# Patient Record
Sex: Female | Born: 1960
Health system: Southern US, Community
[De-identification: ages and names within clinical notes are randomized; demographics above are authoritative.]

## PROBLEM LIST (undated history)

## (undated) DIAGNOSIS — N189 Chronic kidney disease, unspecified: Secondary | ICD-10-CM

## (undated) DIAGNOSIS — R011 Cardiac murmur, unspecified: Secondary | ICD-10-CM

## (undated) DIAGNOSIS — R748 Abnormal levels of other serum enzymes: Secondary | ICD-10-CM

## (undated) DIAGNOSIS — D509 Iron deficiency anemia, unspecified: Secondary | ICD-10-CM

## (undated) DIAGNOSIS — I10 Essential (primary) hypertension: Secondary | ICD-10-CM

## (undated) DIAGNOSIS — R9431 Abnormal electrocardiogram [ECG] [EKG]: Secondary | ICD-10-CM

## (undated) DIAGNOSIS — R0600 Dyspnea, unspecified: Secondary | ICD-10-CM

## (undated) DIAGNOSIS — F32A Depression, unspecified: Secondary | ICD-10-CM

## (undated) DIAGNOSIS — E669 Obesity, unspecified: Secondary | ICD-10-CM

## (undated) DIAGNOSIS — R600 Localized edema: Secondary | ICD-10-CM

## (undated) HISTORY — DX: Cardiac murmur, unspecified: R01.1

## (undated) HISTORY — DX: Abnormal levels of other serum enzymes: R74.8

## (undated) HISTORY — DX: Abnormal electrocardiogram (ECG) (EKG): R94.31

## (undated) HISTORY — DX: Obesity, unspecified: E66.9

## (undated) HISTORY — PX: TUBAL LIGATION: SHX77

## (undated) HISTORY — DX: Localized edema: R60.0

## (undated) HISTORY — DX: Iron deficiency anemia, unspecified: D50.9

## (undated) HISTORY — DX: Dyspnea, unspecified: R06.00

## (undated) HISTORY — DX: Essential (primary) hypertension: I10

## (undated) HISTORY — PX: ABDOMINAL HYSTERECTOMY: SHX81

## (undated) HISTORY — DX: Chronic kidney disease, unspecified: N18.9

## (undated) HISTORY — DX: Depression, unspecified: F32.A

---

## 2011-06-14 ENCOUNTER — Encounter (HOSPITAL_COMMUNITY): Payer: Self-pay

## 2011-06-14 ENCOUNTER — Emergency Department (HOSPITAL_COMMUNITY)
Admission: EM | Admit: 2011-06-14 | Discharge: 2011-06-15 | Disposition: A | Discharge status: Home / Self Care | Payer: BC Managed Care – PPO | Attending: Emergency Medicine | Admitting: Emergency Medicine

## 2011-06-14 DIAGNOSIS — S61012A Laceration without foreign body of left thumb without damage to nail, initial encounter: Secondary | ICD-10-CM

## 2011-06-14 DIAGNOSIS — W268XXA Contact with other sharp object(s), not elsewhere classified, initial encounter: Secondary | ICD-10-CM | POA: Insufficient documentation

## 2011-06-14 DIAGNOSIS — S61209A Unspecified open wound of unspecified finger without damage to nail, initial encounter: Secondary | ICD-10-CM | POA: Insufficient documentation

## 2011-06-14 MED ORDER — TETANUS-DIPHTH-ACELL PERTUSSIS 5-2.5-18.5 LF-MCG/0.5 IM SUSP
0.5000 mL | Freq: Once | INTRAMUSCULAR | Status: AC
  Administered 2011-06-14: 0.5 mL via INTRAMUSCULAR
  Filled 2011-06-14: qty 0.5

## 2011-06-14 NOTE — ED Notes (Signed)
Pt was cutting a box open and sliced her left thumb, she was unable to control the bleeding

## 2011-06-14 NOTE — ED Notes (Signed)
Pt sts she was cutting in her kitchen earlier when she sliced her thumb on accident. Thumb still bleeding at this time. Thumb washed and wrapped with gauze until PA able to see patient,.

## 2011-06-15 MED ORDER — LIDOCAINE HCL 1 % IJ SOLN
INTRAMUSCULAR | Status: AC
  Administered 2011-06-15
  Filled 2011-06-15: qty 20

## 2011-06-15 NOTE — ED Provider Notes (Signed)
History     CSN: [id number]  Arrival date & time 06/14/11  2109   First MD Initiated Contact with Patient 06/14/11 2316      Chief Complaint  Patient presents with  . Extremity Laceration    (Consider location/radiation/quality/duration/timing/severity/associated sxs/prior treatment) HPI Comments: Patient here with left thumb laceration - states that she was using a box cutter to open a box when it slipped and she cut her left thumb at the medial aspect of the thumb - continues to ooze blood - reports tetanus not up to date  Patient is a 51 y.o. female presenting with hand pain. The history is provided by the patient. No language interpreter was used.  Hand Pain This is a new problem. The current episode started today. The problem occurs constantly. The problem has been unchanged. Associated symptoms include myalgias. Pertinent negatives include no abdominal pain, anorexia, arthralgias, change in bowel habit, chest pain, chills, congestion, coughing, diaphoresis, fatigue, fever, headaches, joint swelling, nausea, neck pain, numbness, rash, sore throat, swollen glands, urinary symptoms, vertigo, visual change, vomiting or weakness. The symptoms are aggravated by bending. She has tried nothing for the symptoms. The treatment provided no relief.    History reviewed. No pertinent past medical history.  History reviewed. No pertinent past surgical history.  History reviewed. No pertinent family history.  History  Substance Use Topics  . Smoking status: Not on file  . Smokeless tobacco: Not on file  . Alcohol Use: No    OB History    Grav Para Term Preterm Abortions TAB SAB Ect Mult Living                  Review of Systems  Constitutional: Negative for fever, chills, diaphoresis and fatigue.  HENT: Negative for congestion, sore throat and neck pain.   Respiratory: Negative for cough.   Cardiovascular: Negative for chest pain.  Gastrointestinal: Negative for nausea, vomiting,  abdominal pain, anorexia and change in bowel habit.  Musculoskeletal: Positive for myalgias. Negative for joint swelling and arthralgias.  Skin: Negative for rash.  Neurological: Negative for vertigo, weakness, numbness and headaches.  All other systems reviewed and are negative.    Allergies  Review of patient's allergies indicates no known allergies.  Home Medications  No current outpatient prescriptions on file.  BP 157/87  Pulse 9  Temp(Src) 98.3 F (36.8 C) (Oral)  Resp 18  SpO2 96%  Physical Exam  Nursing note and vitals reviewed. Constitutional: She is oriented to person, place, and time. She appears well-developed and well-nourished. No distress.  HENT:  Head: Normocephalic and atraumatic.  Right Ear: External ear normal.  Left Ear: External ear normal.  Nose: Nose normal.  Mouth/Throat: Oropharynx is clear and moist. No oropharyngeal exudate.  Eyes: Conjunctivae are normal. Pupils are equal, round, and reactive to light. No scleral icterus.  Neck: Normal range of motion. Neck supple.  Cardiovascular: Normal rate, regular rhythm and normal heart sounds.  Exam reveals no gallop and no friction rub.   No murmur heard. Pulmonary/Chest: Effort normal and breath sounds normal. She exhibits no tenderness.  Abdominal: Soft. Bowel sounds are normal. She exhibits no distension. There is no tenderness.  Musculoskeletal: Normal range of motion. She exhibits no edema and no tenderness.       1cm laceration to left thumb - currently hemostatic  Lymphadenopathy:    She has no cervical adenopathy.  Neurological: She is alert and oriented to person, place, and time. No cranial nerve deficit.  Skin:  Skin is warm and dry. No rash noted. No erythema. No pallor.  Psychiatric: She has a normal mood and affect. [name]. Judgment and thought content normal.    ED Course  Procedures (including critical care time)  Labs Reviewed - No data to display No results  found.   LACERATION REPAIR Performed by: Patrecia Pour. Authorized by: Patrecia Pour Consent: Verbal consent obtained. Risks and benefits: risks, benefits and alternatives were discussed Consent given by: patient Patient identity confirmed: provided demographic data Prepped and Draped in normal sterile fashion Wound explored  Laceration Location: left thumb  Laceration Length: 1cm  No Foreign Bodies seen or palpated  Anesthesia: local infiltration  Local anesthetic: lidocaine 1% without epinephrine  Anesthetic total: 2 ml  Irrigation method: syringe Amount of cleaning: standard  Skin closure: nylon 5.0  Number of sutures: 2  Technique: simple interrupted  Patient tolerance: Patient tolerated the procedure well with no immediate complications.  Left thumb laceration  MDM  Patient here with left thumb laceration - no evidence of foreign body - placed two sutures - heart rate noted to be 9 in triage, but checked myself and it was 86        Pailyn [place] C. [place], [place] 06/15/11 0020

## 2011-06-15 NOTE — ED Notes (Signed)
Patient given discharge instructions, information, prescriptions, and diet order. Patient states that they adequately understand discharge information given and to return to ED if symptoms return or worsen.     

## 2011-06-15 NOTE — ED Provider Notes (Signed)
Medical screening examination/treatment/procedure(s) were performed by non-physician practitioner and as supervising physician I was immediately available for consultation/collaboration.  Olivia Mackie, MD 06/15/11 [phone]

## 2011-06-15 NOTE — Discharge Instructions (Signed)
Laceration Care, Adult A laceration is a cut that goes through all layers of the skin. The cut goes into the tissue beneath the skin. HOME CARE For stitches (sutures) or staples:  Keep the cut clean and dry.   If you have a bandage (dressing), change it at least once a day. Change the bandage if it gets wet or dirty, or as told by your doctor.   Wash the cut with soap and water 2 times a day. Rinse the cut with water. Pat it dry with a clean towel.   Put a thin layer of medicated cream on the cut as told by your doctor.   You may shower after the first 24 hours. Do not soak the cut in water until the stitches are removed.   Only take medicines as told by your doctor.   Have your stitches or staples removed as told by your doctor.  For skin adhesive strips:  Keep the cut clean and dry.   Do not get the strips wet. You may take a bath, but be careful to keep the cut dry.   If the cut gets wet, pat it dry with a clean towel.   The strips will fall off on their own. Do not remove the strips that are still stuck to the cut.  For wound glue:  You may shower or take baths. Do not soak or scrub the cut. Do not swim. Avoid heavy sweating until the glue falls off on its own. After a shower or bath, pat the cut dry with a clean towel.   Do not put medicine on your cut until the glue falls off.   If you have a bandage, do not put tape over the glue.   Avoid lots of sunlight or tanning lamps until the glue falls off. Put sunscreen on the cut for the first year to reduce your scar.   The glue will fall off on its own. Do not pick at the glue.  You may need a tetanus shot if:  You cannot remember when you had your last tetanus shot.   You have never had a tetanus shot.  If you need a tetanus shot and you choose not to have one, you may get tetanus. Sickness from tetanus can be serious. GET HELP RIGHT AWAY IF:   Your pain does not get better with medicine.   Your arm, hand, leg, or  foot loses feeling (numbness) or changes color.   Your cut is bleeding.   Your joint feels weak, or you cannot use your joint.   You have painful lumps on your body.   Your cut is red, puffy (swollen), or painful.   You have a red line on the skin near the cut.   You have yellowish-white fluid (pus) coming from the cut.   You have a fever.   You have a bad smell coming from the cut or bandage.   Your cut breaks open before or after stitches are removed.   You notice something coming out of the cut, such as wood or glass.   You cannot move a finger or toe.  MAKE SURE YOU:   Understand these instructions.   Will watch your condition.   Will get help right away if you are not doing well or get worse.  Document Released: 06/29/2007 Document Revised: 12/30/2010 Document Reviewed: 07/06/2010 [facility] Patient Information 2012 [place], [place].Laceration Care, Adult A laceration is a cut or lesion that goes through all layers  of the skin and into the tissue just beneath the skin. TREATMENT  Some lacerations may not require closure. Some lacerations may not be able to be closed due to an increased risk of infection. It is important to see your caregiver as soon as possible after an injury to minimize the risk of infection and maximize the opportunity for successful closure. If closure is appropriate, pain medicines may be given, if needed. The wound will be cleaned to help prevent infection. Your caregiver will use stitches (sutures), staples, wound glue (adhesive), or skin adhesive strips to repair the laceration. These tools bring the skin edges together to allow for faster healing and a better cosmetic outcome. However, all wounds will heal with a scar. Once the wound has healed, scarring can be minimized by covering the wound with sunscreen during the day for 1 full year. HOME CARE INSTRUCTIONS  For sutures or staples:  Keep the wound clean and dry.   If you were given a bandage  (dressing), you should change it at least once a day. Also, change the dressing if it becomes wet or dirty, or as directed by your caregiver.   Wash the wound with soap and water 2 times a day. Rinse the wound off with water to remove all soap. Pat the wound dry with a clean towel.   After cleaning, apply a thin layer of the antibiotic ointment as recommended by your caregiver. This will help prevent infection and keep the dressing from sticking.   You may shower as usual after the first 24 hours. Do not soak the wound in water until the sutures are removed.   Only take over-the-counter or prescription medicines for pain, discomfort, or fever as directed by your caregiver.   Get your sutures or staples removed as directed by your caregiver.  For skin adhesive strips:  Keep the wound clean and dry.   Do not get the skin adhesive strips wet. You may bathe carefully, using caution to keep the wound dry.   If the wound gets wet, pat it dry with a clean towel.   Skin adhesive strips will fall off on their own. You may trim the strips as the wound heals. Do not remove skin adhesive strips that are still stuck to the wound. They will fall off in time.  For wound adhesive:  You may briefly wet your wound in the shower or bath. Do not soak or scrub the wound. Do not swim. Avoid periods of heavy perspiration until the skin adhesive has fallen off on its own. After showering or bathing, gently pat the wound dry with a clean towel.   Do not apply liquid medicine, cream medicine, or ointment medicine to your wound while the skin adhesive is in place. This may loosen the film before your wound is healed.   If a dressing is placed over the wound, be careful not to apply tape directly over the skin adhesive. This may cause the adhesive to be pulled off before the wound is healed.   Avoid prolonged exposure to sunlight or tanning lamps while the skin adhesive is in place. Exposure to ultraviolet light in  the first year will darken the scar.   The skin adhesive will usually remain in place for 5 to 10 days, then naturally fall off the skin. Do not pick at the adhesive film.  You may need a tetanus shot if:  You cannot remember when you had your last tetanus shot.   You have never  had a tetanus shot.  If you get a tetanus shot, your arm may swell, get red, and feel warm to the touch. This is common and not a problem. If you need a tetanus shot and you choose not to have one, there is a rare chance of getting tetanus. Sickness from tetanus can be serious. SEEK MEDICAL CARE IF:   You have redness, swelling, or increasing pain in the wound.   You see a red line that goes away from the wound.   You have yellowish-white fluid (pus) coming from the wound.   You have a fever.   You notice a bad smell coming from the wound or dressing.   Your wound breaks open before or after sutures have been removed.   You notice something coming out of the wound such as wood or glass.   Your wound is on your hand or foot and you cannot move a finger or toe.  SEEK IMMEDIATE MEDICAL CARE IF:   Your pain is not controlled with prescribed medicine.   You have severe swelling around the wound causing pain and numbness or a change in color in your arm, hand, leg, or foot.   Your wound splits open and starts bleeding.   You have worsening numbness, weakness, or loss of function of any joint around or beyond the wound.   You develop painful lumps near the wound or on the skin anywhere on your body.  MAKE SURE YOU:   Understand these instructions.   Will watch your condition.   Will get help right away if you are not doing well or get worse.  Document Released: 01/10/2005 Document Revised: 12/30/2010 Document Reviewed: 07/06/2010 [facility] Patient Information 2012 [place], [place].

## 2011-10-21 ENCOUNTER — Emergency Department (HOSPITAL_COMMUNITY): Payer: BC Managed Care – PPO

## 2011-10-21 ENCOUNTER — Encounter (HOSPITAL_COMMUNITY): Payer: Self-pay | Admitting: *Deleted

## 2011-10-21 ENCOUNTER — Emergency Department (HOSPITAL_COMMUNITY)
Admission: EM | Admit: 2011-10-21 | Discharge: 2011-10-21 | Disposition: A | Discharge status: Home / Self Care | Payer: BC Managed Care – PPO | Attending: Emergency Medicine | Admitting: Emergency Medicine

## 2011-10-21 DIAGNOSIS — K5900 Constipation, unspecified: Secondary | ICD-10-CM

## 2011-10-21 LAB — CBC WITH DIFFERENTIAL/PLATELET
Basophils Absolute: 0 10*3/uL (ref 0.0–0.1)
Basophils Relative: 1 % (ref 0–1)
Eosinophils Absolute: 0.2 10*3/uL (ref 0.0–0.7)
Eosinophils Relative: 5 % (ref 0–5)
HCT: 34.6 % — ABNORMAL LOW (ref 36.0–46.0)
Hemoglobin: 12 g/dL (ref 12.0–15.0)
Lymphocytes Relative: 24 % (ref 12–46)
Lymphs Abs: 1.1 10*3/uL (ref 0.7–4.0)
MCH: 29.3 pg (ref 26.0–34.0)
MCHC: 34.7 g/dL (ref 30.0–36.0)
MCV: 84.4 fL (ref 78.0–100.0)
Monocytes Absolute: 0.5 10*3/uL (ref 0.1–1.0)
Monocytes Relative: 11 % (ref 3–12)
Neutro Abs: 2.8 10*3/uL (ref 1.7–7.7)
Neutrophils Relative %: 60 % (ref 43–77)
Platelets: 174 10*3/uL (ref 150–400)
RBC: 4.1 MIL/uL (ref 3.87–5.11)
RDW: 13.8 % (ref 11.5–15.5)
WBC: 4.8 10*3/uL (ref 4.0–10.5)

## 2011-10-21 LAB — COMPREHENSIVE METABOLIC PANEL
ALT: 40 U/L — ABNORMAL HIGH (ref 0–35)
AST: 29 U/L (ref 0–37)
Albumin: 3.7 g/dL (ref 3.5–5.2)
Alkaline Phosphatase: 149 U/L — ABNORMAL HIGH (ref 39–117)
BUN: 13 mg/dL (ref 6–23)
CO2: 28 mEq/L (ref 19–32)
Calcium: 9.4 mg/dL (ref 8.4–10.5)
Chloride: 104 mEq/L (ref 96–112)
Creatinine, Ser: 0.64 mg/dL (ref 0.50–1.10)
GFR calc Af Amer: >90 mL/min (ref >90)
GFR calc non Af Amer: >90 mL/min (ref >90)
Glucose, Bld: 105 mg/dL — ABNORMAL HIGH (ref 70–99)
Potassium: 3.4 mEq/L — ABNORMAL LOW (ref 3.5–5.1)
Sodium: 142 mEq/L (ref 135–145)
Total Bilirubin: 0.6 mg/dL (ref 0.3–1.2)
Total Protein: 7.8 g/dL (ref 6.0–8.3)

## 2011-10-21 LAB — URINALYSIS, ROUTINE W REFLEX MICROSCOPIC
Bilirubin Urine: NEGATIVE
Glucose, UA: NEGATIVE mg/dL
Hgb urine dipstick: NEGATIVE
Ketones, ur: NEGATIVE mg/dL
Nitrite: NEGATIVE
Protein, ur: NEGATIVE mg/dL
Specific Gravity, Urine: 1.015 (ref 1.005–1.030)
Urobilinogen, UA: 0.2 mg/dL (ref 0.0–1.0)
pH: 5.5 (ref 5.0–8.0)

## 2011-10-21 LAB — URINE MICROSCOPIC-ADD ON

## 2011-10-21 MED ORDER — LACTULOSE 10 GM/15ML PO SOLN
20.0000 g | Freq: Once | ORAL | Status: AC
  Administered 2011-10-21: 20 g via ORAL
  Filled 2011-10-21 (×2): qty 30

## 2011-10-21 MED ORDER — DOCUSATE SODIUM 100 MG PO CAPS: 100.0000 mg | ORAL_CAPSULE | Freq: Two times a day (BID) | ORAL | Status: DC

## 2011-10-21 MED ORDER — SODIUM CHLORIDE 0.9 % IV BOLUS (SEPSIS)
1000.0000 mL | Freq: Once | INTRAVENOUS | Status: AC
  Administered 2011-10-21: 1000 mL via INTRAVENOUS

## 2011-10-21 MED ORDER — GLYCERIN (LAXATIVE) 2.1 G RE SUPP
1.0000 | Freq: Once | RECTAL | Status: AC
  Administered 2011-10-21: 14:00:00 via RECTAL
  Filled 2011-10-21: qty 1

## 2011-10-21 MED ORDER — LACTULOSE 10 GM/15ML PO SOLN: 10.0000 g | Freq: Two times a day (BID) | ORAL | Status: DC

## 2011-10-21 NOTE — ED Notes (Signed)
Constipation for a week; took laxative yesterday with no relief. Had similar symptoms in the past r/t thyroid tumors, 2002

## 2011-10-21 NOTE — ED Notes (Signed)
Returned from xray

## 2011-10-21 NOTE — ED Provider Notes (Signed)
Medical screening examination/treatment/procedure(s) were performed by non-physician practitioner and as supervising physician I was immediately available for consultation/collaboration.   Celene Kras, MD 10/21/11 [phone]

## 2011-10-21 NOTE — ED Provider Notes (Signed)
History     CSN: [id number]  Arrival date & time 10/21/11  4098   First MD Initiated Contact with Patient 10/21/11 1206      Chief Complaint  Patient presents with  . Constipation    (Consider location/radiation/quality/duration/timing/severity/associated sxs/prior treatment) Patient is a 51 y.o. female presenting with constipation.  Constipation     Patient presents with a one week history of constipation.  She reports lower abdominal cramping and rectal discomfort.  She denies nausea, vomiting, diarrhea, fever, urinary changes,chest pain, shortness of breath and rectal bleeding.  The patient took a laxative yesterday (10/20/11) evening with no relief.  She states her normal bowel movements are 1-2x daily.  Her last episode of constipation was 11 years ago and the patient had to have a mass removed from her bladder and a hysterectomy.        No past medical history on file.  Past Surgical History  Procedure Date  . Tubal ligation     No family history on file.  History  Substance Use Topics  . Smoking status: Not on file  . Smokeless tobacco: Not on file  . Alcohol Use: No    OB History    Grav Para Term Preterm Abortions TAB SAB Ect Mult Living                  Review of Systems  Gastrointestinal: Positive for constipation.   All pertinent positives and negatives in the history of present illness  Allergies  Review of patient's allergies indicates no known allergies.  Home Medications   Current Outpatient Rx  Name Route Sig Dispense Refill  . ADULT MULTIVITAMIN W/MINERALS CH Oral Take 1 tablet by mouth daily.      BP 139/104  Pulse 101  Temp 98 F (36.7 C) (Oral)  Resp 14  SpO2 97%  Physical Exam  Nursing note and vitals reviewed. Constitutional: She is oriented to person, place, and time. She appears well-developed and well-nourished.  HENT:  Head: Normocephalic and atraumatic.  Mouth/Throat: Oropharynx is clear and moist.  Eyes: Pupils are  equal, round, and reactive to light.  Cardiovascular: Normal rate, regular rhythm and normal heart sounds.  Exam reveals no gallop and no friction rub.   No murmur heard. Pulmonary/Chest: Effort normal and breath sounds normal. No respiratory distress.  Abdominal: Soft. Normal appearance and bowel sounds are normal. There is no rigidity, no rebound and no guarding.    Neurological: She is alert and oriented to person, place, and time.  Skin: Skin is warm and dry. No rash noted.    ED Course  Procedures (including critical care time)   Labs Reviewed  URINALYSIS, ROUTINE W REFLEX MICROSCOPIC  CBC WITH DIFFERENTIAL  COMPREHENSIVE METABOLIC PANEL   The patient be treated for constipation, and referred back to her primary care Dr. she's told to increase her fluid intake.  Told to return here for any worsening in her condition.  We'll give her treatment for this at home. Patient has had a similar episode to this in the past  MDM  MDM Reviewed: nursing note and vitals Interpretation: labs and x-ray            Carlyle Dolly, PA-C 10/21/11 1508

## 2011-10-21 NOTE — ED Notes (Signed)
Patient to Bathroom and had a BM.  Moderate amount of stool passed.

## 2015-04-09 ENCOUNTER — Other Ambulatory Visit (INDEPENDENT_AMBULATORY_CARE_PROVIDER_SITE_OTHER): Payer: Managed Care, Other (non HMO)

## 2015-04-09 ENCOUNTER — Ambulatory Visit (INDEPENDENT_AMBULATORY_CARE_PROVIDER_SITE_OTHER): Payer: Managed Care, Other (non HMO) | Admitting: Internal Medicine

## 2015-04-09 ENCOUNTER — Encounter: Payer: Self-pay | Admitting: Internal Medicine

## 2015-04-09 DIAGNOSIS — M25473 Effusion, unspecified ankle: Secondary | ICD-10-CM | POA: Insufficient documentation

## 2015-04-09 DIAGNOSIS — R5383 Other fatigue: Secondary | ICD-10-CM | POA: Diagnosis not present

## 2015-04-09 LAB — TSH: TSH: 1.09 u[IU]/mL (ref 0.35–4.50)

## 2015-04-09 LAB — COMPREHENSIVE METABOLIC PANEL
ALK PHOS: 135 U/L — AB (ref 39–117)
ALT: 48 U/L — ABNORMAL HIGH (ref 0–35)
AST: 35 U/L (ref 0–37)
Albumin: 4.3 g/dL (ref 3.5–5.2)
BILIRUBIN TOTAL: 0.7 mg/dL (ref 0.2–1.2)
BUN: 13 mg/dL (ref 6–23)
CO2: 30 meq/L (ref 19–32)
Calcium: 9.6 mg/dL (ref 8.4–10.5)
Chloride: 104 mEq/L (ref 96–112)
Creatinine, Ser: 0.73 mg/dL (ref 0.40–1.20)
GFR: 106.45 mL/min (ref >60.00)
GLUCOSE: 110 mg/dL — AB (ref 70–99)
POTASSIUM: 3.8 meq/L (ref 3.5–5.1)
SODIUM: 141 meq/L (ref 135–145)
TOTAL PROTEIN: 7.4 g/dL (ref 6.0–8.3)

## 2015-04-09 LAB — LIPID PANEL
CHOL/HDL RATIO: 3
Cholesterol: 262 mg/dL — ABNORMAL HIGH (ref 0–200)
HDL: 87.2 mg/dL (ref >39.00)
LDL CALC: 155 mg/dL — AB (ref 0–99)
NonHDL: 174.73
TRIGLYCERIDES: 100 mg/dL (ref 0.0–149.0)
VLDL: 20 mg/dL (ref 0.0–40.0)

## 2015-04-09 LAB — CBC
HCT: 35 % — ABNORMAL LOW (ref 36.0–46.0)
Hemoglobin: 11.9 g/dL — ABNORMAL LOW (ref 12.0–15.0)
MCHC: 33.9 g/dL (ref 30.0–36.0)
MCV: 86 fl (ref 78.0–100.0)
PLATELETS: 227 10*3/uL (ref 150.0–400.0)
RBC: 4.07 Mil/uL (ref 3.87–5.11)
RDW: 14.8 % (ref 11.5–15.5)
WBC: 4 10*3/uL (ref 4.0–10.5)

## 2015-04-09 LAB — VITAMIN D 25 HYDROXY (VIT D DEFICIENCY, FRACTURES): VITD: 8.02 ng/mL — AB (ref 30.00–100.00)

## 2015-04-09 LAB — BRAIN NATRIURETIC PEPTIDE: PRO B NATRI PEPTIDE: 10 pg/mL (ref 0.0–100.0)

## 2015-04-09 LAB — HEMOGLOBIN A1C: HEMOGLOBIN A1C: 6 % (ref 4.6–6.5)

## 2015-04-09 LAB — VITAMIN B12: Vitamin B-12: 199 pg/mL — ABNORMAL LOW (ref 211–911)

## 2015-04-09 NOTE — Progress Notes (Signed)
   Subjective:    Patient ID: Alexandria Morrison, female    DOB: [birth year], 55 y.o.   MRN: [id number]  HPI The patient is a 55 YO female coming in new for several concerns. She is feeling tired all the time right now. She is working third shift and sleeps off hours. She is sleeping a good amount of time but does not wake rested. She is gaining weight and has suffered with constipation in the past. She is not currently having diarrhea or constipation. Denies headaches in the morning when she awakes and is not aware is she snores.  Next concern is swelling in her ankles. 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 They swell during her job. They go down when she props them or with sleeping. She has gotten compression stockings for her ankles that seem to help keep them from swelling. Right ankle more than left but both swell. No pain generally but some pain in the right with swelling.   She would like to attempt weight loss with medicine if needed. Not exercising due to no energy. Has not changed diet at all and she is increasing in weight the last 2-3 years.   PMH, [facility], social history reviewed and updated.   Review of Systems  Constitutional: Positive for activity change and fatigue. Negative for fever, chills, appetite change and unexpected weight change.  HENT: Negative.   Eyes: Negative.   Respiratory: Negative for cough, chest tightness, shortness of breath and wheezing.   Cardiovascular: Positive for leg swelling. Negative for chest pain and palpitations.  Gastrointestinal: Negative for nausea, vomiting, abdominal pain, diarrhea, constipation and abdominal distention.  Musculoskeletal: Positive for arthralgias. Negative for myalgias, back pain and gait problem.  Skin: Negative.   Neurological: Negative for dizziness, syncope, weakness, light-headedness, numbness and headaches.  Psychiatric/Behavioral: Negative.       Objective:   Physical Exam  Constitutional: She  is oriented to person, place, and time. She appears well-developed and well-nourished.  Overweight  HENT:  Head: Normocephalic and atraumatic.  Eyes: EOM are normal.  Neck: Normal range of motion. No JVD present. No thyromegaly present.  Cardiovascular: Normal rate and regular rhythm.   Pulmonary/Chest: Effort normal and breath sounds normal. No respiratory distress. She has no wheezes. She has no rales.  Abdominal: Soft. Bowel sounds are normal. She exhibits no distension. There is no tenderness. There is no rebound.  Musculoskeletal:  Trace pedal edema bilaterally.   Neurological: She is alert and oriented to person, place, and time. Coordination normal.  Skin: Skin is warm and dry.  Psychiatric: She has a normal mood and affect.   Filed Vitals:   04/09/15 0911  BP: 118/60  Pulse: 83  Temp: 98.3 F (36.8 C)  TempSrc: Oral  Resp: 14  Height:  (1.6 m)  Weight: 228 lb (103.42 kg)  SpO2: 98%      Assessment & Plan:

## 2015-04-09 NOTE — Progress Notes (Signed)
Pre visit review using our clinic review tool, if applicable. No additional management support is needed unless otherwise documented below in the visit note. 

## 2015-04-09 NOTE — Assessment & Plan Note (Signed)
Checking labs for complications, BP at goal. Talked to her about the need for exercise and healthier eating. If labs normal okay for trial of phentermine with visit in 3 months.

## 2015-04-09 NOTE — Assessment & Plan Note (Signed)
Advised her that the best option is compression stocking which we gave rx for. Checking for metabolic causes with labs today. Her morbid obesity make her susceptible to venous insufficiency and we discussed the etiology of that today during the visit.

## 2015-04-09 NOTE — Assessment & Plan Note (Signed)
Unclear etiology and ruling out metabolic causes with CMP, CBC, thyroid, HgA1c today. Next step would be checking for OSA. She does not report snoring but reports not waking rested even with adequate rest and BMI puts her at risk. 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

## 2015-04-09 NOTE — Patient Instructions (Signed)
We will check the lab work and have given you the prescription for the stockings.  If the labs come back with a cause for the tiredness and weight we will treat that.   If they do not show a cause then we will send in a medicine called phentermine for weight loss and energy. Take 1 pill daily and we need to see you back in about 3 months.   The most common side effects of the phentermine are some nausea (we recommend to take with food) and some fast heart rate. If these symptoms bother you call us and we can make changes if needed.   Health Maintenance, Female Adopting a healthy lifestyle and getting preventive care can go a long way to promote health and wellness. Talk with your health care provider about what schedule of regular examinations is right for you. This is a good chance for you to check in with your provider about disease prevention and staying healthy. In between checkups, there are plenty of things you can do on your own. Experts have done a lot of research about which lifestyle changes and preventive measures are most likely to keep you healthy. Ask your health care provider for more information. WEIGHT AND DIET  Eat a healthy diet  Be sure to include plenty of vegetables, fruits, low-fat dairy products, and lean protein.  Do not eat a lot of foods high in solid fats, added sugars, or salt.  Get regular exercise. This is one of the most important things you can do for your health.  Most adults should exercise for at least 150 minutes each week. The exercise should increase your heart rate and make you sweat (moderate-intensity exercise).  Most adults should also do strengthening exercises at least twice a week. This is in addition to the moderate-intensity exercise.  Maintain a healthy weight  Body mass index (BMI) is a measurement that can be used to identify possible weight problems. It estimates body fat based on height and weight. Your health care provider can help  determine your BMI and help you achieve or maintain a healthy weight.  For females 42 years of age and older:   A BMI below 18.5 is considered underweight.  A BMI of 18.5 to 24.9 is normal.  A BMI of 25 to 29.9 is considered overweight.  A BMI of 30 and above is considered obese.  Watch levels of cholesterol and blood lipids  You should start having your blood tested for lipids and cholesterol at 55 years of age, then have this test every 5 years.  You may need to have your cholesterol levels checked more often if:  Your lipid or cholesterol levels are high.  You are older than 55 years of age.  You are at high risk for heart disease.  CANCER SCREENING   Lung Cancer  Lung cancer screening is recommended for adults 52-57 years old who are at high risk for lung cancer because of a history of smoking.  A yearly low-dose CT scan of the lungs is recommended for people who:  Currently smoke.  Have quit within the past 15 years.  Have at least a 30-pack-year history of smoking. A pack year is smoking an average of one pack of cigarettes a day for 1 year.  Yearly screening should continue until it has been 15 years since you quit.  Yearly screening should stop if you develop a health problem that would prevent you from having lung cancer treatment.  Breast  Cancer  Practice breast self-awareness. This means understanding how your breasts normally appear and feel.  It also means doing regular breast self-exams. Let your health care provider know about any changes, no matter how small.  If you are in your 20s or 30s, you should have a clinical breast exam (CBE) by a health care provider every 1-3 years as part of a regular health exam.  If you are 79 or older, have a CBE every year. Also consider having a breast X-ray (mammogram) every year.  If you have a family history of breast cancer, talk to your health care provider about genetic screening.  If you are at high risk  for breast cancer, talk to your health care provider about having an MRI and a mammogram every year.  Breast cancer gene (BRCA) assessment is recommended for women who have family members with BRCA-related cancers. BRCA-related cancers include:  Breast.  Ovarian.  Tubal.  Peritoneal cancers.  Results of the assessment will determine the need for genetic counseling and BRCA1 and BRCA2 testing. Cervical Cancer Your health care provider may recommend that you be screened regularly for cancer of the pelvic organs (ovaries, uterus, and vagina). This screening involves a pelvic examination, including checking for microscopic changes to the surface of your cervix (Pap test). You may be encouraged to have this screening done every 3 years, beginning at age 60.  For women ages 33-65, health care providers may recommend pelvic exams and Pap testing every 3 years, or they may recommend the Pap and pelvic exam, combined with testing for human papilloma virus (HPV), every 5 years. Some types of HPV increase your risk of cervical cancer. Testing for HPV may also be done on women of any age with unclear Pap test results.  Other health care providers may not recommend any screening for nonpregnant women who are considered low risk for pelvic cancer and who do not have symptoms. Ask your health care provider if a screening pelvic exam is right for you.  If you have had past treatment for cervical cancer or a condition that could lead to cancer, you need Pap tests and screening for cancer for at least 20 years after your treatment. If Pap tests have been discontinued, your risk factors (such as having a new sexual partner) need to be reassessed to determine if screening should resume. Some women have medical problems that increase the chance of getting cervical cancer. In these cases, your health care provider may recommend more frequent screening and Pap tests. Colorectal Cancer  This type of cancer can be  detected and often prevented.  Routine colorectal cancer screening usually begins at 55 years of age and continues through 55 years of age.  Your health care provider may recommend screening at an earlier age if you have risk factors for colon cancer.  Your health care provider may also recommend using home test kits to check for hidden blood in the stool.  A small camera at the end of a tube can be used to examine your colon directly (sigmoidoscopy or colonoscopy). This is done to check for the earliest forms of colorectal cancer.  Routine screening usually begins at age 86.  Direct examination of the colon should be repeated every 5-10 years through 55 years of age. However, you may need to be screened more often if early forms of precancerous polyps or small growths are found. Skin Cancer  Check your skin from head to toe regularly.  Tell your health care provider  about any new moles or changes in moles, especially if there is a change in a mole's shape or color.  Also tell your health care provider if you have a mole that is larger than the size of a pencil eraser.  Always use sunscreen. Apply sunscreen liberally and repeatedly throughout the day.  Protect yourself by wearing long sleeves, pants, a wide-brimmed hat, and sunglasses whenever you are outside. HEART DISEASE, DIABETES, AND HIGH BLOOD PRESSURE   High blood pressure causes heart disease and increases the risk of stroke. High blood pressure is more likely to develop in:  People who have blood pressure in the high end of the normal range (130-139/85-89 mm Hg).  People who are overweight or obese.  People who are African American.  If you are 9-31 years of age, have your blood pressure checked every 3-5 years. If you are 3 years of age or older, have your blood pressure checked every year. You should have your blood pressure measured twice--once when you are at a hospital or clinic, and once when you are not at a  hospital or clinic. Record the average of the two measurements. To check your blood pressure when you are not at a hospital or clinic, you can use:  An automated blood pressure machine at a pharmacy.  A home blood pressure monitor.  If you are between 32 years and 78 years old, ask your health care provider if you should take aspirin to prevent strokes.  Have regular diabetes screenings. This involves taking a blood sample to check your fasting blood sugar level.  If you are at a normal weight and have a low risk for diabetes, have this test once every three years after 55 years of age.  If you are overweight and have a high risk for diabetes, consider being tested at a younger age or more often. PREVENTING INFECTION  Hepatitis B  If you have a higher risk for hepatitis B, you should be screened for this virus. You are considered at high risk for hepatitis B if:  You were born in a country where hepatitis B is common. Ask your health care provider which countries are considered high risk.  Your parents were born in a high-risk country, and you have not been immunized against hepatitis B (hepatitis B vaccine).  You have HIV or AIDS.  You use needles to inject street drugs.  You live with someone who has hepatitis B.  You have had sex with someone who has hepatitis B.  You get hemodialysis treatment.  You take certain medicines for conditions, including cancer, organ transplantation, and autoimmune conditions. Hepatitis C  Blood testing is recommended for:  Everyone born from [birth year] through 1965.  Anyone with known risk factors for hepatitis C. Sexually transmitted infections (STIs)  You should be screened for sexually transmitted infections (STIs) including gonorrhea and chlamydia if:  You are sexually active and are younger than 55 years of age.  You are older than 55 years of age and your health care provider tells you that you are at risk for this type of  infection.  Your sexual activity has changed since you were last screened and you are at an increased risk for chlamydia or gonorrhea. Ask your health care provider if you are at risk.  If you do not have HIV, but are at risk, it may be recommended that you take a prescription medicine daily to prevent HIV infection. This is called pre-exposure prophylaxis (PrEP). You are considered  at risk if:  You are sexually active and do not regularly use condoms or know the HIV status of your partner(s).  You take drugs by injection.  You are sexually active with a partner who has HIV. Talk with your health care provider about whether you are at high risk of being infected with HIV. If you choose to begin PrEP, you should first be tested for HIV. You should then be tested every 3 months for as long as you are taking PrEP.  PREGNANCY   If you are premenopausal and you may become pregnant, ask your health care provider about preconception counseling.  If you may become pregnant, take 400 to 800 micrograms (mcg) of folic acid every day.  If you want to prevent pregnancy, talk to your health care provider about birth control (contraception). OSTEOPOROSIS AND MENOPAUSE   Osteoporosis is a disease in which the bones lose minerals and strength with aging. This can result in serious bone fractures. Your risk for osteoporosis can be identified using a bone density scan.  If you are 54 years of age or older, or if you are at risk for osteoporosis and fractures, ask your health care provider if you should be screened.  Ask your health care provider whether you should take a calcium or vitamin D supplement to lower your risk for osteoporosis.  Menopause may have certain physical symptoms and risks.  Hormone replacement therapy may reduce some of these symptoms and risks. Talk to your health care provider about whether hormone replacement therapy is right for you.  HOME CARE INSTRUCTIONS   Schedule regular  health, dental, and eye exams.  Stay current with your immunizations.   Do not use any tobacco products including cigarettes, chewing tobacco, or electronic cigarettes.  If you are pregnant, do not drink alcohol.  If you are breastfeeding, limit how much and how often you drink alcohol.  Limit alcohol intake to no more than 1 drink per day for nonpregnant women. One drink equals 12 ounces of beer, 5 ounces of wine, or 1 ounces of hard liquor.  Do not use street drugs.  Do not share needles.  Ask your health care provider for help if you need support or information about quitting drugs.  Tell your health care provider if you often feel depressed.  Tell your health care provider if you have ever been abused or do not feel safe at home.   This information is not intended to replace advice given to you by your health care provider. Make sure you discuss any questions you have with your health care provider.   Document Released: 07/26/2010 Document Revised: 01/31/2014 Document Reviewed: 12/12/2012 Elsevier Interactive Patient Education [employer].

## 2015-04-10 ENCOUNTER — Telehealth: Payer: Self-pay | Admitting: Internal Medicine

## 2015-04-10 ENCOUNTER — Other Ambulatory Visit: Payer: Self-pay | Admitting: Internal Medicine

## 2015-04-10 MED ORDER — VITAMIN B-12 1000 MCG PO TABS: 1000.0000 ug | ORAL_TABLET | Freq: Every day | ORAL | Status: DC

## 2015-04-10 MED ORDER — PHENTERMINE HCL 37.5 MG PO CAPS: 37.5000 mg | ORAL_CAPSULE | ORAL | Status: DC

## 2015-04-10 MED ORDER — VITAMIN D (ERGOCALCIFEROL) 1.25 MG (50000 UNIT) PO CAPS: 50000.0000 [IU] | ORAL_CAPSULE | ORAL | Status: DC

## 2015-04-10 NOTE — Telephone Encounter (Signed)
Pt called regarding lab results. I told her Dr. Lawana Chambers notes and let her know she has prescriptions ready for her. No need to call

## 2015-07-10 ENCOUNTER — Ambulatory Visit: Payer: Managed Care, Other (non HMO) | Admitting: Internal Medicine

## 2015-07-13 ENCOUNTER — Encounter: Payer: Self-pay | Admitting: Internal Medicine

## 2015-07-13 ENCOUNTER — Ambulatory Visit (INDEPENDENT_AMBULATORY_CARE_PROVIDER_SITE_OTHER): Payer: Managed Care, Other (non HMO) | Admitting: Internal Medicine

## 2015-07-13 DIAGNOSIS — E559 Vitamin D deficiency, unspecified: Secondary | ICD-10-CM

## 2015-07-13 MED ORDER — PHENTERMINE HCL 37.5 MG PO CAPS: 37.5000 mg | ORAL_CAPSULE | ORAL | Status: DC

## 2015-07-13 NOTE — Patient Instructions (Signed)
We have refilled the phentermine and given it to you today.   We can see you back in about 3-4 months to check on the weight.   Keep working on the exercise and think about doing something in water to to better on the knees.  Exercising to Lose Weight Exercising can help you to lose weight. In order to lose weight through exercise, you need to do vigorous-intensity exercise. You can tell that you are exercising with vigorous intensity if you are breathing very hard and fast and cannot hold a conversation while exercising. Moderate-intensity exercise helps to maintain your current weight. You can tell that you are exercising at a moderate level if you have a higher heart rate and faster breathing, but you are still able to hold a conversation. HOW OFTEN SHOULD I EXERCISE? Choose an activity that you enjoy and set realistic goals. Your health care provider can help you to make an activity plan that works for you. Exercise regularly as directed by your health care provider. This may include:  Doing resistance training twice each week, such as:  Push-ups.  Sit-ups.  Lifting weights.  Using resistance bands.  Doing a given intensity of exercise for a given amount of time. Choose from these options:  150 minutes of moderate-intensity exercise every week.  75 minutes of vigorous-intensity exercise every week.  A mix of moderate-intensity and vigorous-intensity exercise every week. Children, pregnant women, people who are out of shape, people who are overweight, and older adults may need to consult a health care provider for individual recommendations. If you have any sort of medical condition, be sure to consult your health care provider before starting a new exercise program. WHAT ARE SOME ACTIVITIES THAT CAN HELP ME TO LOSE WEIGHT?   Walking at a rate of at least 4.5 miles an hour.  Jogging or running at a rate of 5 miles per hour.  Biking at a rate of at least 10 miles per  hour.  Lap swimming.  Roller-skating or in-line skating.  Cross-country skiing.  Vigorous competitive sports, such as football, basketball, and soccer.  Jumping rope.  Aerobic dancing. HOW CAN I BE MORE ACTIVE IN MY DAY-TO-DAY ACTIVITIES?  Use the stairs instead of the elevator.  Take a walk during your lunch break.  If you drive, park your car farther away from work or school.  If you take public transportation, get off one stop early and walk the rest of the way.  Make all of your phone calls while standing up and walking around.  Get up, stretch, and walk around every 30 minutes throughout the day. WHAT GUIDELINES SHOULD I FOLLOW WHILE EXERCISING?  Do not exercise so much that you hurt yourself, feel dizzy, or get very short of breath.  Consult your health care provider prior to starting a new exercise program.  Wear comfortable clothes and shoes with good support.  Drink plenty of water while you exercise to prevent dehydration or heat stroke. Body water is lost during exercise and must be replaced.  Work out until you breathe faster and your heart beats faster.   This information is not intended to replace advice given to you by your health care provider. Make sure you discuss any questions you have with your health care provider.   Document Released: 02/12/2010 Document Revised: 01/31/2014 Document Reviewed: 06/13/2013 Elsevier Interactive Patient Education [employer]

## 2015-07-13 NOTE — Progress Notes (Signed)
Pre visit review using our clinic review tool, if applicable. No additional management support is needed unless otherwise documented below in the visit note. 

## 2015-07-13 NOTE — Assessment & Plan Note (Signed)
Has taken the high dose replacement and feeling improved.

## 2015-07-13 NOTE — Assessment & Plan Note (Signed)
Rx for phentermine today and see her back in 3 months. We talked about exercise options including the pool to be better on her knees.

## 2015-07-13 NOTE — Progress Notes (Signed)
   Subjective:    Patient ID: Alexandria Morrison, female    DOB: Sep 06, 1960, 55 y.o.   MRN: [id number]  HPI The patient is a 55 YO female coming in for follow up of her morbid obesity. She started taking phentermine about 3 months ago. This is helping with energy and appetite. She had some problems with arthritis in her knee and she is now getting shots in her knee which has limited her exercise. She is still losing about 4 pounds since last time although was 10 pounds at one time. Denies side effects and would like to try another 3 months. Energy levels are better.   Review of Systems  Constitutional: Positive for activity change. Negative for fever, chills, appetite change, fatigue and unexpected weight change.  Respiratory: Negative for cough, chest tightness, shortness of breath and wheezing.   Cardiovascular: Negative for chest pain, palpitations and leg swelling.  Gastrointestinal: Negative for nausea, vomiting, abdominal pain, diarrhea, constipation and abdominal distention.  Musculoskeletal: Positive for arthralgias. Negative for myalgias, back pain and gait problem.  Skin: Negative.   Neurological: Negative for dizziness, syncope, weakness, light-headedness, numbness and headaches.      Objective:   Physical Exam  Constitutional: She is oriented to person, place, and time. She appears well-developed and well-nourished.  Overweight  HENT:  Head: Normocephalic and atraumatic.  Eyes: EOM are normal.  Neck: Normal range of motion.  Cardiovascular: Normal rate and regular rhythm.   Pulmonary/Chest: Effort normal and breath sounds normal. No respiratory distress. She has no wheezes. She has no rales.  Abdominal: Soft. She exhibits no distension. There is no tenderness. There is no rebound.  Neurological: She is alert and oriented to person, place, and time. Coordination normal.  Skin: Skin is warm and dry.   Filed Vitals:   07/13/15 0947  BP: 116/72  Pulse: 104  Temp: 98.5 F (36.9 C)   TempSrc: Oral  Resp: 14  Height: 5\' 3"  (1.6 m)  Weight: 226 lb (102.513 kg)  SpO2: 98%      Assessment & Plan:

## 2015-10-12 ENCOUNTER — Ambulatory Visit: Payer: Managed Care, Other (non HMO) | Admitting: Internal Medicine

## 2016-02-26 DIAGNOSIS — Z23 Encounter for immunization: Secondary | ICD-10-CM | POA: Diagnosis not present

## 2016-05-24 ENCOUNTER — Telehealth: Payer: Self-pay | Admitting: Internal Medicine

## 2016-05-24 NOTE — Telephone Encounter (Signed)
Patient would like to transfer care due to office being so close to home.

## 2016-05-24 NOTE — Telephone Encounter (Signed)
Fine

## 2016-05-24 NOTE — Telephone Encounter (Signed)
Okay with me 

## 2016-06-16 ENCOUNTER — Encounter: Payer: Self-pay | Admitting: Family Medicine

## 2016-06-16 ENCOUNTER — Ambulatory Visit (INDEPENDENT_AMBULATORY_CARE_PROVIDER_SITE_OTHER): Payer: BLUE CROSS/BLUE SHIELD | Admitting: Family Medicine

## 2016-06-16 DIAGNOSIS — Z1231 Encounter for screening mammogram for malignant neoplasm of breast: Secondary | ICD-10-CM | POA: Diagnosis not present

## 2016-06-16 DIAGNOSIS — M25472 Effusion, left ankle: Secondary | ICD-10-CM | POA: Diagnosis not present

## 2016-06-16 DIAGNOSIS — Z13220 Encounter for screening for lipoid disorders: Secondary | ICD-10-CM

## 2016-06-16 DIAGNOSIS — M25471 Effusion, right ankle: Secondary | ICD-10-CM

## 2016-06-16 DIAGNOSIS — Z1239 Encounter for other screening for malignant neoplasm of breast: Secondary | ICD-10-CM

## 2016-06-16 MED ORDER — PHENTERMINE HCL 15 MG PO TBDP: 15.0000 mg | ORAL_TABLET | Freq: Every morning | ORAL | 2 refills | Status: DC

## 2016-06-16 NOTE — Progress Notes (Signed)
Alexandria Morrison is a 56 y.o. female is here to [place].   Patient Care Team: Briscoe Deutscher, DO as PCP - General (Family Medicine)   History of Present Illness:   Alexandria Morrison CMA acting as scribe for Dr. Juleen China.  HPI Patient comes in today to establish care. 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 She does work third shift and has to stand on her feet for 8 hours at a time. It is during that time that she has some aching and swelling of her bilateral ankles. She is due for her mammogram. She is status post hysterectomy. She has gained weight over the past year and is interested in weight loss management. She has not been working on making healthy food choices. She has not been exercising regularly.  Health Maintenance Due  Topic Date Due  . PAP SMEAR  04/08/1981  . MAMMOGRAM  04/09/2010   VACCINATION STATUS: Immunization History  Administered Date(s) Administered  . Influenza-Unspecified 12/09/2014  . Tdap 06/14/2011   PMHx, SurgHx, SocialHx, Medications, and Allergies were reviewed in the Visit Navigator and updated as appropriate.   No past medical history on file.  Past Surgical History:  Procedure Laterality Date  . ABDOMINAL HYSTERECTOMY    . TUBAL LIGATION     Family History  Problem Relation Age of Onset  . Hypertension Sister   . Diabetes Brother    Social History  Substance Use Topics  . Smoking status: Never Smoker  . Smokeless tobacco: Never Used  . Alcohol use No   Current Medications and Allergies:   .  vitamin B-12 (CYANOCOBALAMIN) 1000 MCG tablet, Take 1 tablet (1,000 mcg total) by mouth daily., Disp: 30 tablet, Rfl: 11  No Known Allergies   Review of Systems:   Review of Systems  Constitutional: Negative for chills, fever, malaise/fatigue and weight loss.  Respiratory: Negative for cough, shortness of breath and wheezing.   Cardiovascular: Positive for leg swelling. Negative for chest pain and  palpitations.  Gastrointestinal: Negative for abdominal pain, constipation, diarrhea, nausea and vomiting.  Genitourinary: Negative for dysuria and urgency.  Musculoskeletal: Negative for joint pain and myalgias.  Skin: Negative for rash.  Neurological: Negative for dizziness and headaches.  Psychiatric/Behavioral: Negative for depression, substance abuse and suicidal ideas. The patient is not nervous/anxious.     Vitals:   Vitals:   06/16/16 0750  BP: 120/84  Pulse: 64  Temp: 98.6 F (37 C)  TempSrc: Oral  SpO2: 98%  Weight: 232 lb 9.6 oz (105.5 kg)  Height: _0  (1.6 m)     Body mass index is 41.2 kg/m.  Physical Exam:   Physical Exam  Constitutional: [name].  HENT:  Head: Normocephalic and atraumatic.  Eyes: EOM are normal. Pupils are equal, round, and reactive to light.  Neck: Normal range of motion. Neck supple.  Cardiovascular: Normal rate, regular rhythm, normal heart sounds and intact distal pulses.   Pulmonary/Chest: Effort normal.  Abdominal: Soft.  Musculoskeletal: She exhibits edema.  Bilateral ankle edema, nonpitting.   Skin: Skin is warm.  Psychiatric: She has a normal mood and affect. [name].  Nursing note and vitals reviewed.   Results for orders placed or performed in visit on 04/09/15  B Nat Peptide  Result Value Ref Range   Pro B Natriuretic peptide (BNP) 10.0 0.0 - 100.0 pg/mL  HgB A1c  Result Value Ref Range   Hgb A1c MFr Bld 6.0  4.6 - 6.5 %  Comp Met (CMET)  Result Value Ref Range   Sodium 141 135 - 145 mEq/L   Potassium 3.8 3.5 - 5.1 mEq/L   Chloride 104 96 - 112 mEq/L   CO2 30 19 - 32 mEq/L   Glucose, Bld 110 (H) 70 - 99 mg/dL   BUN 13 6 - 23 mg/dL   Creatinine, Ser 0.73 0.40 - 1.20 mg/dL   Total Bilirubin 0.7 0.2 - 1.2 mg/dL   Alkaline Phosphatase 135 (H) 39 - 117 U/L   AST 35 0 - 37 U/L   ALT 48 (H) 0 - 35 U/L   Total Protein 7.4 6.0 - 8.3 g/dL   Albumin 4.3 3.5 - 5.2 g/dL   Calcium 9.6 8.4 -  10.5 mg/dL   GFR 106.45 >60.00 mL/min  CBC  Result Value Ref Range   WBC 4.0 4.0 - 10.5 K/uL   RBC 4.07 3.87 - 5.11 Mil/uL   Platelets 227.0 150.0 - 400.0 K/uL   Hemoglobin 11.9 (L) 12.0 - 15.0 g/dL   HCT 35.0 (L) 36.0 - 46.0 %   MCV 86.0 78.0 - 100.0 fl   MCHC 33.9 30.0 - 36.0 g/dL   RDW 14.8 11.5 - 15.5 %  Lipid panel  Result Value Ref Range   Cholesterol 262 (H) 0 - 200 mg/dL   Triglycerides 100.0 0.0 - 149.0 mg/dL   HDL 87.20 >39.00 mg/dL   VLDL 20.0 0.0 - 40.0 mg/dL   LDL Cholesterol 155 (H) 0 - 99 mg/dL   Total CHOL/HDL Ratio 3    NonHDL 174.73   B12  Result Value Ref Range   Vitamin B-12 199 (L) 211 - 911 pg/mL  TSH  Result Value Ref Range   TSH 1.09 0.35 - 4.50 uIU/mL  Vitamin D (25 hydroxy)  Result Value Ref Range   VITD 8.02 (L) 30.00 - 100.00 ng/mL   Assessment and Plan:   Alexandria Morrison was seen today for establish care.  Diagnoses and all orders for this visit:  Morbid obesity ([place]) Comments: The patient is asked to make an attempt to improve diet and exercise patterns to aid in medical management of this problem.  Orders: -     CBC; Future -     Comprehensive metabolic panel; Future -     Phentermine HCl 15 MG TBDP; Take 15 mg by mouth every morning.  Screening for breast cancer -     MM SCREENING BREAST TOMO BILATERAL; Future  Lipid screening -     Lipid panel; Future  Records requested if needed. Time spent with the patient: 30 minutes, of which >50% was spent Discussing weight loss strategies. The patient has taken weight loss medication the past and would like to restart. We discussed appropriate use the medication in conjunction with exercise and healthy food choices. We also spent time reviewing her past medical history and health maintenance issues. We reviewed the risks and benefits of breast cancer screening. We put a plan in place for future visits.   . Reviewed expectations re: course of current medical issues. . Discussed self-management of  symptoms. . Outlined signs and symptoms indicating need for more acute intervention. . Patient verbalized understanding and all questions were answered. Marland Kitchen Health Maintenance issues including appropriate healthy diet, exercise, and smoking avoidance were discussed with patient. . See orders for this visit as documented in the electronic medical record. . Patient received an After Visit Summary.  CMA served as Education administrator during this visit. History, Physical,  and Plan performed by medical provider. The above documentation has been reviewed and is accurate and complete. Briscoe Deutscher, D.O.   Briscoe Deutscher, DO [place] 06/16/2016  Future Appointments Date Time Provider [place]  06/27/2016 8:15 AM [facility] None  09/16/2016 8:15 AM Briscoe Deutscher, DO [facility] None

## 2016-06-16 NOTE — Patient Instructions (Signed)
INSTRUCTIONS TO DECREASED THE SWELLING IN YOUR LEGS:   Minimize use of anti-inflammatories (Advil, Naprosyn, Aleve). Try Tylenol (acetaminophen) instead. Do not use more than 3 grams (3000 mg) total per day.  Use your legs. Working the lower leg muscles helps pump the fluid out of your legs.  Avoid prolonged standing if possible.  Elevate your legs when you are sitting.  Use support stockings daily.  Limit your salt intake.  Work on weight loss.   

## 2016-06-21 ENCOUNTER — Other Ambulatory Visit (INDEPENDENT_AMBULATORY_CARE_PROVIDER_SITE_OTHER): Payer: BLUE CROSS/BLUE SHIELD

## 2016-06-21 DIAGNOSIS — Z13220 Encounter for screening for lipoid disorders: Secondary | ICD-10-CM | POA: Diagnosis not present

## 2016-06-21 LAB — LIPID PANEL
Cholesterol: 244 mg/dL — ABNORMAL HIGH (ref 0–200)
HDL: 87 mg/dL (ref >39.00)
LDL Cholesterol: 144 mg/dL — ABNORMAL HIGH (ref 0–99)
NonHDL: 156.99
Total CHOL/HDL Ratio: 3
Triglycerides: 66 mg/dL (ref 0.0–149.0)
VLDL: 13.2 mg/dL (ref 0.0–40.0)

## 2016-06-21 LAB — CBC
HCT: 36 % (ref 36.0–46.0)
Hemoglobin: 12.1 g/dL (ref 12.0–15.0)
MCHC: 33.5 g/dL (ref 30.0–36.0)
MCV: 86.1 fl (ref 78.0–100.0)
Platelets: 194 10*3/uL (ref 150.0–400.0)
RBC: 4.19 Mil/uL (ref 3.87–5.11)
RDW: 14.4 % (ref 11.5–15.5)
WBC: 4.2 10*3/uL (ref 4.0–10.5)

## 2016-06-21 LAB — COMPREHENSIVE METABOLIC PANEL
ALT: 94 U/L — ABNORMAL HIGH (ref 0–35)
AST: 70 U/L — ABNORMAL HIGH (ref 0–37)
Albumin: 4.2 g/dL (ref 3.5–5.2)
Alkaline Phosphatase: 157 U/L — ABNORMAL HIGH (ref 39–117)
BUN: 18 mg/dL (ref 6–23)
CO2: 27 mEq/L (ref 19–32)
Calcium: 9.4 mg/dL (ref 8.4–10.5)
Chloride: 104 mEq/L (ref 96–112)
Creatinine, Ser: 0.7 mg/dL (ref 0.40–1.20)
GFR: 111.24 mL/min (ref >60.00)
Glucose, Bld: 102 mg/dL — ABNORMAL HIGH (ref 70–99)
Potassium: 3.7 mEq/L (ref 3.5–5.1)
Sodium: 140 mEq/L (ref 135–145)
Total Bilirubin: 0.5 mg/dL (ref 0.2–1.2)
Total Protein: 7.2 g/dL (ref 6.0–8.3)

## 2016-06-27 ENCOUNTER — Telehealth: Payer: Self-pay | Admitting: Family Medicine

## 2016-06-27 ENCOUNTER — Other Ambulatory Visit: Payer: BLUE CROSS/BLUE SHIELD

## 2016-06-27 ENCOUNTER — Other Ambulatory Visit: Payer: Self-pay

## 2016-06-27 DIAGNOSIS — R945 Abnormal results of liver function studies: Secondary | ICD-10-CM

## 2016-06-27 DIAGNOSIS — R7989 Other specified abnormal findings of blood chemistry: Secondary | ICD-10-CM

## 2016-06-27 DIAGNOSIS — R7301 Impaired fasting glucose: Secondary | ICD-10-CM

## 2016-06-27 NOTE — Telephone Encounter (Signed)
Patient returning a call from Autumn about labs, transferred call to Autumn to advise.

## 2016-06-28 NOTE — Telephone Encounter (Signed)
Spoke with patient yesterday. See lab results.

## 2016-07-06 ENCOUNTER — Ambulatory Visit
Admission: RE | Admit: 2016-07-06 | Discharge: 2016-07-06 | Disposition: A | Discharge status: Home / Self Care | Payer: BLUE CROSS/BLUE SHIELD | Source: Ambulatory Visit | Attending: Family Medicine | Admitting: Family Medicine

## 2016-07-06 DIAGNOSIS — Z1231 Encounter for screening mammogram for malignant neoplasm of breast: Secondary | ICD-10-CM | POA: Diagnosis not present

## 2016-07-06 DIAGNOSIS — Z1239 Encounter for other screening for malignant neoplasm of breast: Secondary | ICD-10-CM

## 2016-07-07 ENCOUNTER — Other Ambulatory Visit: Payer: Self-pay | Admitting: Family Medicine

## 2016-07-07 DIAGNOSIS — R928 Other abnormal and inconclusive findings on diagnostic imaging of breast: Secondary | ICD-10-CM

## 2016-07-08 ENCOUNTER — Encounter: Payer: Self-pay | Admitting: Family Medicine

## 2016-07-12 ENCOUNTER — Ambulatory Visit
Admission: RE | Admit: 2016-07-12 | Discharge: 2016-07-12 | Disposition: A | Discharge status: Home / Self Care | Payer: BLUE CROSS/BLUE SHIELD | Source: Ambulatory Visit | Attending: Family Medicine | Admitting: Family Medicine

## 2016-07-12 DIAGNOSIS — R928 Other abnormal and inconclusive findings on diagnostic imaging of breast: Secondary | ICD-10-CM

## 2016-07-12 DIAGNOSIS — N6489 Other specified disorders of breast: Secondary | ICD-10-CM | POA: Diagnosis not present

## 2016-07-13 ENCOUNTER — Other Ambulatory Visit (INDEPENDENT_AMBULATORY_CARE_PROVIDER_SITE_OTHER): Payer: BLUE CROSS/BLUE SHIELD

## 2016-07-13 DIAGNOSIS — R7989 Other specified abnormal findings of blood chemistry: Secondary | ICD-10-CM

## 2016-07-13 DIAGNOSIS — R7301 Impaired fasting glucose: Secondary | ICD-10-CM | POA: Diagnosis not present

## 2016-07-13 DIAGNOSIS — R945 Abnormal results of liver function studies: Secondary | ICD-10-CM | POA: Diagnosis not present

## 2016-07-13 LAB — HEPATIC FUNCTION PANEL
ALT: 34 U/L (ref 0–35)
AST: 24 U/L (ref 0–37)
Albumin: 4.2 g/dL (ref 3.5–5.2)
Alkaline Phosphatase: 155 U/L — ABNORMAL HIGH (ref 39–117)
Bilirubin, Direct: 0.1 mg/dL (ref 0.0–0.3)
Total Bilirubin: 0.6 mg/dL (ref 0.2–1.2)
Total Protein: 7.2 g/dL (ref 6.0–8.3)

## 2016-07-13 LAB — HEMOGLOBIN A1C: Hgb A1c MFr Bld: 6 % (ref 4.6–6.5)

## 2016-09-16 ENCOUNTER — Encounter: Payer: Self-pay | Admitting: Family Medicine

## 2016-09-16 ENCOUNTER — Ambulatory Visit (INDEPENDENT_AMBULATORY_CARE_PROVIDER_SITE_OTHER): Payer: BLUE CROSS/BLUE SHIELD | Admitting: Family Medicine

## 2016-09-16 DIAGNOSIS — L209 Atopic dermatitis, unspecified: Secondary | ICD-10-CM | POA: Diagnosis not present

## 2016-09-16 DIAGNOSIS — Z23 Encounter for immunization: Secondary | ICD-10-CM | POA: Diagnosis not present

## 2016-09-16 MED ORDER — TRIAMCINOLONE ACETONIDE 0.025 % EX OINT: 1.0000 "application " | TOPICAL_OINTMENT | Freq: Two times a day (BID) | CUTANEOUS | 0 refills | Status: DC

## 2016-09-16 MED ORDER — PHENTERMINE HCL 37.5 MG PO TABS: 37.5000 mg | ORAL_TABLET | Freq: Every day | ORAL | 0 refills | Status: DC

## 2016-09-16 NOTE — Progress Notes (Signed)
Atira Borello is a 56 y.o. female is here for follow up.  History of Present Illness:   Britt Bottom CMA acting as scribe for Dr. Earlene Plater.  HPI: Patient comes in today to follow up for her Phentermine. Patient stopped taking the medication due to having a job interview and was afraid she would not pass a drug screen. Has the new job now. She did not feel that the low dose of Phentermine helped her. She would like to try the higher dose. No side effects noted.   Dry spot on left elbow. Itchy. No exposures. She has done nothing for treatment.   Health Maintenance Due  Topic Date Due  . Hepatitis C Screening  1960/05/05  . HIV Screening  04/09/1975  . PAP SMEAR  04/08/1981  . COLONOSCOPY  04/09/2010   Depression screen PHQ 2/9 09/16/2016  Decreased Interest 0  Down, Depressed, Hopeless 0  PHQ - 2 Score 0   PMHx, SurgHx, SocialHx, FamHx, Medications, and Allergies were reviewed in the Visit Navigator and updated as appropriate.   Patient Active Problem List   Diagnosis Date Noted  . Ankle edema, bilateral 06/16/2016  . Vitamin D deficiency 07/13/2015  . Morbid obesity (HCC) 04/09/2015   Social History  Substance Use Topics  . Smoking status: Never Smoker  . Smokeless tobacco: Never Used  . Alcohol use No   Current Medications and Allergies:  No current outpatient prescriptions on file.  No Known Allergies Review of Systems   Pertinent items are noted in the HPI. Otherwise, ROS is negative.  Vitals:   Vitals:   09/16/16 0811  BP: 112/76  Pulse: 83  Temp: 98.3 F (36.8 C)  TempSrc: Oral  SpO2: 98%  Weight: 239 lb 3.2 oz (108.5 kg)  Height: 5\' 3"  (1.6 m)     Body mass index is 42.37 kg/m. Physical Exam:   Physical Exam  Constitutional: [name].  HENT:  Head: Normocephalic and atraumatic.  Eyes: Pupils are equal, round, and reactive to light. EOM are normal.  Neck: Normal range of motion. Neck supple.  Cardiovascular: Normal rate,  regular rhythm, normal heart sounds and intact distal pulses.   Pulmonary/Chest: Effort normal.  Abdominal: Soft.  Skin: Skin is warm.  Psychiatric: She has a normal mood and affect. [name].  Nursing note and vitals reviewed.   Results for orders placed or performed in visit on 07/13/16  Hepatic function panel  Result Value Ref Range   Total Bilirubin 0.6 0.2 - 1.2 mg/dL   Bilirubin, Direct 0.1 0.0 - 0.3 mg/dL   Alkaline Phosphatase 155 (H) 39 - 117 U/L   AST 24 0 - 37 U/L   ALT 34 0 - 35 U/L   Total Protein 7.2 6.0 - 8.3 g/dL   Albumin 4.2 3.5 - 5.2 g/dL  Hemoglobin W0J  Result Value Ref Range   Hgb A1c MFr Bld 6.0 4.6 - 6.5 %   Assessment and Plan:   Diagnoses and all orders for this visit:  Morbid obesity (HCC) Comments: Okay to restart. Tolerated medication well in the past. No side effects. Three months provided with precautions.  Orders: -     phentermine (ADIPEX-P) 37.5 MG tablet; Take 1 tablet (37.5 mg total) by mouth daily before breakfast. -     phentermine (ADIPEX-P) 37.5 MG tablet; Take 1 tablet (37.5 mg total) by mouth daily before breakfast. -     phentermine (ADIPEX-P) 37.5 MG tablet; Take 1 tablet (37.5 mg  total) by mouth daily before breakfast.  Need for immunization against influenza -     Flu Vaccine QUAD 36+ mos IM  Atopic dermatitis, mild Comments: Dry, itchy patch noted on left elbow. No red flags. Treatment below. Orders: -     triamcinolone (KENALOG) 0.025 % ointment; Apply 1 application topically 2 (two) times daily.   . Reviewed expectations re: course of current medical issues. . Discussed self-management of symptoms. . Outlined signs and symptoms indicating need for more acute intervention. . Patient verbalized understanding and all questions were answered. Marland Kitchen Health Maintenance issues including appropriate healthy diet, exercise, and smoking avoidance were discussed with patient. . See orders for this visit as documented in  the electronic medical record. . Patient received an After Visit Summary.  CMA served as Neurosurgeon during this visit. History, Physical, and Plan performed by medical provider. The above documentation has been reviewed and is accurate and complete. Helane Rima, D.O.  Helane Rima, DO [place] 09/17/2016  Future Appointments Date Time Provider Department Center  12/13/2016 9:30 AM Helane Rima, DO [facility] None

## 2016-11-21 ENCOUNTER — Telehealth: Payer: Self-pay | Admitting: Family Medicine

## 2016-11-21 ENCOUNTER — Encounter: Payer: Self-pay | Admitting: Surgical

## 2016-11-21 ENCOUNTER — Ambulatory Visit (INDEPENDENT_AMBULATORY_CARE_PROVIDER_SITE_OTHER): Payer: BLUE CROSS/BLUE SHIELD | Admitting: Family Medicine

## 2016-11-21 ENCOUNTER — Encounter: Payer: Self-pay | Admitting: Family Medicine

## 2016-11-21 VITALS — BP 146/84 | HR 105 | Temp 98.2°F | Ht 63.0 in | Wt 227.0 lb

## 2016-11-21 DIAGNOSIS — B029 Zoster without complications: Secondary | ICD-10-CM | POA: Diagnosis not present

## 2016-11-21 MED ORDER — HYDROCODONE-ACETAMINOPHEN 5-325 MG PO TABS: 1.0000 | ORAL_TABLET | Freq: Four times a day (QID) | ORAL | 0 refills | Status: DC | PRN

## 2016-11-21 MED ORDER — GABAPENTIN 100 MG PO CAPS: 100.0000 mg | ORAL_CAPSULE | Freq: Three times a day (TID) | ORAL | 0 refills | Status: DC

## 2016-11-21 MED ORDER — VALACYCLOVIR HCL 1 G PO TABS: 1000.0000 mg | ORAL_TABLET | Freq: Three times a day (TID) | ORAL | 0 refills | Status: AC

## 2016-11-21 NOTE — Progress Notes (Signed)
   Alexandria Morrison is a 56 y.o. female here for an acute visit.  History of Present Illness:   Rash  This is a new problem. The current episode started in the past 7 days. The problem has been gradually worsening since onset. The affected locations include the chest. The rash is characterized by blistering, pain, redness, itchiness and burning. She was exposed to nothing. Pertinent negatives include no congestion, cough, diarrhea, fatigue, fever, joint pain, shortness of breath or vomiting. Past treatments include cold compress and analgesics. The treatment provided mild relief.   PMHx, SurgHx, SocialHx, Medications, and Allergies were reviewed in the Visit Navigator and updated as appropriate.  Current Medications:   .  triamcinolone (KENALOG) 0.025 % ointment, Apply 1 application topically 2 (two) times daily., Disp: 30 g, Rfl: 0  No Known Allergies   Review of Systems:   Pertinent items are noted in the HPI. Otherwise, ROS is negative.  Vitals:   Vitals:   11/21/16 0905  BP: (!) 146/84  Pulse: (!) 105  Temp: 98.2 F (36.8 C)  TempSrc: Oral  SpO2: 97%  Weight: 227 lb (103 kg)  Height: 5\' 3"  (1.6 m)     Body mass index is 40.21 kg/m.   Physical Exam:   Physical Exam  Constitutional: [name].  HENT:  Head: Normocephalic and atraumatic.  Eyes: Pupils are equal, round, and reactive to light. EOM are normal.  Neck: Normal range of motion. Neck supple.  Cardiovascular: Normal rate, regular rhythm, normal heart sounds and intact distal pulses.   Pulmonary/Chest: Effort normal.  Abdominal: Soft.  Skin: Skin is warm.  Severe zoster along left thoracic dermatome.  Psychiatric: She has a normal mood and affect. [name].  Nursing note and vitals reviewed.   Assessment and Plan:   Alexandria Morrison was seen today for rash.  Diagnoses and all orders for this visit:  Herpes zoster without complication -     valACYclovir (VALTREX) 1000 MG tablet; Take  1 tablet (1,000 mg total) by mouth 3 (three) times daily. -     HYDROcodone-acetaminophen (NORCO/VICODIN) 5-325 MG tablet; Take 1 tablet by mouth every 6 (six) hours as needed for moderate pain. -     gabapentin (NEURONTIN) 100 MG capsule; Take 1 capsule (100 mg total) by mouth 3 (three) times daily.   . Reviewed expectations re: course of current medical issues. . Discussed self-management of symptoms. . Outlined signs and symptoms indicating need for more acute intervention. . Patient verbalized understanding and all questions were answered. Marland Kitchen Health Maintenance issues including appropriate healthy diet, exercise, and smoking avoidance were discussed with patient. . See orders for this visit as documented in the electronic medical record. . Patient received an After Visit Summary.   Helane Rima, DO [place] 11/21/2016  Future Appointments Date Time Provider Department Center  12/13/2016 9:40 AM Helane Rima, DO [facility] None

## 2016-11-21 NOTE — Telephone Encounter (Signed)
Patient called back stating triage was unable to hear patient on phone. Sent patient to triage again. Patient still coming in at 9am.

## 2016-11-21 NOTE — Telephone Encounter (Signed)
Patient Name: Alexandria FeyMARCIA Mitnick  DOB: 03-08-1960    Initial Comment Caller has a rash on her breast and back with a bad headache. Her chest hurts when she breathes. Thinks she may have ate something causing the rash. Pressure on her chest when she breathes.    Nurse Assessment  Nurse: Scarlette ArStandifer, RN, Heather Date/Time (Eastern Time): 11/21/2016 8:47:11 AM  Confirm and document reason for call. If symptomatic, describe symptoms. ---Caller has a rash on her breast and back with a bad headache. Her chest hurts when she breathes. Thinks she may have ate something causing the rash. Pressure on her chest when she breathes.  Does the patient have any new or worsening symptoms? ---Yes  Will a triage be completed? ---Yes  Related visit to physician within the last 2 weeks? ---No  Does the PT have any chronic conditions? (i.e. diabetes, asthma, etc.) ---No  Is this a behavioral health or substance abuse call? ---No     Guidelines    Guideline Title Affirmed Question Affirmed Notes  Shingles Patient sounds very sick or weak to the triager    Final Disposition User   Go to ED Now (or PCP triage) Scarlette ArStandifer, RN, Heather    Referrals  REFERRED TO PCP OFFICE   Caller Disagree/Comply Comply  Caller Understands Yes  PreDisposition Call Doctor

## 2016-11-21 NOTE — Telephone Encounter (Signed)
Patient called in with symptoms of "red rash on back and under breast, painful breathing and bad migraine." Scheduled patient for 9 am with Dr. Earlene Plater. Also sent patient to triage. Awaiting note from triage.

## 2016-11-21 NOTE — Telephone Encounter (Signed)
Pt has an appointment at 9:00 AM with Dr. Earlene Plater.  PLEASE NOTE: All timestamps contained within this report are represented as [place] Standard Time. CONFIDENTIALTY NOTICE: This fax transmission is intended only for the addressee. It contains information that is legally privileged, confidential or otherwise protected from use or disclosure. If you are not the intended recipient, you are strictly prohibited from reviewing, disclosing, copying using or disseminating any of this information or taking any action in reliance on or regarding this information. If you have received this fax in error, please notify us immediately by telephone so that we can arrange for its return to [place]. Phone: [phone], Toll-Free: [phone], Fax: [phone] Page: 1 of 2 Call Id: [id number] [facility] - Client TELEPHONE ADVICE RECORD [facility] Medical Call Center Patient Name: Alexandria Morrison Gender: Female DOB: [birth year] Age: 56 Y 7 M 13 D Return Phone Number: [phone] (Primary) Address: City/State/Zip: [place] [address] Client [facility] - Administrator, sports at [facility] Physician Helane Rima- DO Contact Type Call Who Is Calling Patient / Member / Family / Caregiver Call Type Triage / Clinical Relationship To Patient Self Return Phone Number [phone] (Primary) Chief Complaint BREATHING - shortness of breath or sounds breathless Reason for Call Symptomatic / Request for Health Information Initial Comment Caller has a rash on her breast and back with a bad headache. Her chest hurts when she breathes. Thinks she may have ate something causing the rash. Pressure on her chest when she breathes. Appointment Disposition EMR Patient Reports Appointment Already Scheduled Info pasted into Epic Yes Translation No Nurse Assessment Nurse: Scarlette Ar, RN, Heather Date/Time (Eastern Time): 11/21/2016 8:47:11  AM Confirm and document reason for call. If symptomatic, describe symptoms. ---Caller has a rash on her breast and back with a bad headache. Her chest hurts when she breathes. Thinks she may have ate something causing the rash. Pressure on her chest when she breathes. Does the patient have any new or worsening symptoms? ---Yes Will a triage be completed? ---Yes Related visit to physician within the last 2 weeks? ---No Does the PT have any chronic conditions? (i.e. diabetes, asthma, etc.) ---No Is this a behavioral health or substance abuse call? ---No Guidelines Guideline Title Affirmed Question Affirmed Notes Nurse Date/Time (Eastern Time) Shingles Patient sounds very sick or weak to the triager [employer], RN, Heather 11/21/2016 8:48:10 AM Disp. Time Lamount Cohen Time) Disposition Final User 11/21/2016 8:44:07 AM Send to Urgent Queue Frutoso Chase 11/21/2016 8:54:40 AM Call Completed Standifer, RN, Herbert Seta PLEASE NOTE: All timestamps contained within this report are represented as [place] Standard Time. CONFIDENTIALTY NOTICE: This fax transmission is intended only for the addressee. It contains information that is legally privileged, confidential or otherwise protected from use or disclosure. If you are not the intended recipient, you are strictly prohibited from reviewing, disclosing, copying using or disseminating any of this information or taking any action in reliance on or regarding this information. If you have received this fax in error, please notify us immediately by telephone so that we can arrange for its return to [place]. Phone: [phone], Toll-Free: [phone], Fax: [phone] Page: 2 of 2 Call Id: [id number] 11/21/2016 8:51:17 AM Go to ED Now (or PCP triage) Yes Standifer, RN, Sibyl Parr Disagree/Comply Comply Caller Understands Yes PreDisposition Call Doctor Care Advice Given Per Guideline GO TO ED NOW (OR PCP TRIAGE): CARE ADVICE given per Shingles (Adult)  guideline. Referrals REFERRED TO PCP OFFICE

## 2016-12-13 ENCOUNTER — Ambulatory Visit: Payer: BLUE CROSS/BLUE SHIELD | Admitting: Family Medicine

## 2016-12-13 ENCOUNTER — Encounter: Payer: Self-pay | Admitting: Family Medicine

## 2016-12-13 VITALS — BP 124/76 | HR 95 | Temp 98.4°F | Ht 63.0 in | Wt 224.8 lb

## 2016-12-13 DIAGNOSIS — B0229 Other postherpetic nervous system involvement: Secondary | ICD-10-CM | POA: Diagnosis not present

## 2016-12-13 MED ORDER — IBUPROFEN-FAMOTIDINE 800-26.6 MG PO TABS: 1.0000 | ORAL_TABLET | Freq: Three times a day (TID) | ORAL | 0 refills | Status: DC | PRN

## 2016-12-13 MED ORDER — GABAPENTIN 100 MG PO CAPS: ORAL_CAPSULE | ORAL | 3 refills | Status: DC

## 2016-12-13 NOTE — Progress Notes (Signed)
Alexandria Morrison is a 56 y.o. female is here for follow up.  History of Present Illness:   HPI:   1. Postherpetic neuralgia: Patient continues to have pain at left thoracic dermatome. Lesions are healing well. She finished the Neurontin - felt that it did help. The Norco was too sedating. No new symptoms or concerns.    Health Maintenance Due  Topic Date Due  . Hepatitis C Screening  12-Apr-1960  . HIV Screening  04/09/1975  . PAP SMEAR  04/08/1981  . COLONOSCOPY  04/09/2010   Depression screen PHQ 2/9 09/16/2016  Decreased Interest 0  Down, Depressed, Hopeless 0  PHQ - 2 Score 0   PMHx, SurgHx, SocialHx, FamHx, Medications, and Allergies were reviewed in the Visit Navigator and updated as appropriate.   Patient Active Problem List   Diagnosis Date Noted  . Ankle edema, bilateral 06/16/2016  . Vitamin D deficiency 07/13/2015  . Morbid obesity (HCC) 04/09/2015   Social History   Tobacco Use  . Smoking status: Never Smoker  . Smokeless tobacco: Never Used  Substance Use Topics  . Alcohol use: No    Alcohol/week: 0.0 oz  . Drug use: No   Current Medications and Allergies:   .  gabapentin (NEURONTIN) 100 MG capsule, Take 1 capsule (100 mg total) by mouth 3 (three) times daily., Disp: 30 capsule, Rfl: 0 .  HYDROcodone-acetaminophen (NORCO/VICODIN) 5-325 MG tablet, Take 1 tablet by mouth every 6 (six) hours as needed for moderate pain., Disp: 30 tablet, Rfl: 0 .  triamcinolone (KENALOG) 0.025 % ointment, Apply 1 application topically 2 (two) times daily., Disp: 30 g, Rfl: 0  No Known Allergies   Review of Systems   Pertinent items are noted in the HPI. Otherwise, ROS is negative.  Vitals:   Vitals:   12/13/16 0935  BP: 124/76  Pulse: 95  Temp: 98.4 F (36.9 C)  TempSrc: Oral  SpO2: 96%  Weight: 224 lb 12.8 oz (102 kg)  Height: 5\' 3"  (1.6 m)     Body mass index is 39.82 kg/m.   Physical Exam:   Physical Exam  Constitutional: [name].    HENT:  Head: Normocephalic and atraumatic.  Eyes: EOM are normal. Pupils are equal, round, and reactive to light.  Neck: Normal range of motion. Neck supple.  Cardiovascular: Normal rate, regular rhythm, normal heart sounds and intact distal pulses.  Pulmonary/Chest: Effort normal.  Abdominal: Soft.  Skin: Skin is warm.  Left thoracic shingles lesions healing well.  Psychiatric: She has a normal mood and affect. [name].  Nursing note and vitals reviewed.   Assessment and Plan:   Todd was seen today for follow-up.  Diagnoses and all orders for this visit:  Postherpetic neuralgia -     Ibuprofen-Famotidine (DUEXIS) 800-26.6 MG TABS; Take 1 tablet 3 (three) times daily as needed by mouth. -     gabapentin (NEURONTIN) 100 MG capsule; 1-2 TID prn pain.   . Reviewed expectations re: course of current medical issues. . Discussed self-management of symptoms. . Outlined signs and symptoms indicating need for more acute intervention. . Patient verbalized understanding and all questions were answered. Marland Kitchen Health Maintenance issues including appropriate healthy diet, exercise, and smoking avoidance were discussed with patient. . See orders for this visit as documented in the electronic medical record. . Patient received an After Visit Summary.  Helane Rima, DO [place] 12/14/2016  Future Appointments  Date Time Provider Department Center  02/21/2017  9:15 AM Helane RimaWallace, Brinsley Wence, DO [facility] None

## 2017-02-21 ENCOUNTER — Encounter: Payer: BLUE CROSS/BLUE SHIELD | Admitting: Family Medicine

## 2017-03-29 ENCOUNTER — Encounter: Payer: Self-pay | Admitting: Family Medicine

## 2017-03-29 ENCOUNTER — Encounter: Payer: Self-pay | Admitting: Sports Medicine

## 2017-03-29 ENCOUNTER — Ambulatory Visit (INDEPENDENT_AMBULATORY_CARE_PROVIDER_SITE_OTHER): Payer: BLUE CROSS/BLUE SHIELD | Admitting: Family Medicine

## 2017-03-29 ENCOUNTER — Ambulatory Visit: Payer: Self-pay

## 2017-03-29 ENCOUNTER — Ambulatory Visit: Payer: BLUE CROSS/BLUE SHIELD | Admitting: Sports Medicine

## 2017-03-29 VITALS — BP 136/80 | HR 86 | Temp 98.0°F | Wt 214.8 lb

## 2017-03-29 VITALS — BP 136/80 | HR 86 | Ht 63.0 in | Wt 214.8 lb

## 2017-03-29 DIAGNOSIS — G5603 Carpal tunnel syndrome, bilateral upper limbs: Secondary | ICD-10-CM

## 2017-03-29 DIAGNOSIS — E669 Obesity, unspecified: Secondary | ICD-10-CM | POA: Diagnosis not present

## 2017-03-29 DIAGNOSIS — M25532 Pain in left wrist: Secondary | ICD-10-CM | POA: Diagnosis not present

## 2017-03-29 DIAGNOSIS — M25531 Pain in right wrist: Secondary | ICD-10-CM

## 2017-03-29 DIAGNOSIS — M25572 Pain in left ankle and joints of left foot: Secondary | ICD-10-CM

## 2017-03-29 DIAGNOSIS — M25571 Pain in right ankle and joints of right foot: Secondary | ICD-10-CM | POA: Diagnosis not present

## 2017-03-29 DIAGNOSIS — G8929 Other chronic pain: Secondary | ICD-10-CM | POA: Diagnosis not present

## 2017-03-29 NOTE — Progress Notes (Signed)
Alexandria Morrison is a 57 y.o. female is here for follow up.  History of Present Illness:   Alexandria Morrison, CMA acting as scribe for Dr. Helane Rima.   HPI: Patient in office today for follow up.   She is having bilateral wrist and hand pain. Right grater than left for about 1 year that has increased over time. She has tried brace and over the counter anti inflammatories with no help. Patient states it is worse at night and will wake her up from sleep with pain. She is also having bilateral ankle pain that increases with standing.     Review of Systems  Constitutional: Negative for chills and fever.  HENT: Negative for hearing loss and tinnitus.   Eyes: Negative for blurred vision and double vision.  Respiratory: Negative for cough.   Cardiovascular: Negative for chest pain and palpitations.  Gastrointestinal: Negative for nausea and vomiting.  Genitourinary: Negative for dysuria and urgency.  Musculoskeletal: Negative for neck pain.  Skin: Negative for rash.  Neurological: Negative for dizziness and headaches.  Endo/Heme/Allergies: Does not bruise/bleed easily.   Health Maintenance Due  Topic Date Due  . Hepatitis C Screening  September 14, 1960  . HIV Screening  04/09/1975  . PAP SMEAR  04/08/1981  . Fecal DNA (Cologuard)  04/09/2010   Depression screen PHQ 2/9 09/16/2016  Decreased Interest 0  Down, Depressed, Hopeless 0  PHQ - 2 Score 0   PMHx, SurgHx, SocialHx, FamHx, Medications, and Allergies were reviewed in the Visit Navigator and updated as appropriate.   Patient Active Problem List   Diagnosis Date Noted  . Ankle edema, bilateral 06/16/2016  . Vitamin D deficiency 07/13/2015  . Morbid obesity (HCC) 04/09/2015   Social History   Tobacco Use  . Smoking status: Never Smoker  . Smokeless tobacco: Never Used  Substance Use Topics  . Alcohol use: No    Alcohol/week: 0.0 oz  . Drug use: No   Current Medications and Allergies:   .  None  No Known Allergies    Review of Systems   Pertinent items are noted in the HPI. Otherwise, ROS is negative.  Vitals:   Vitals:   03/29/17 0921  BP: 136/80  Pulse: 86  Temp: 98 F (36.7 C)  TempSrc: Oral  SpO2: 96%  Weight: 214 lb 12.8 oz (97.4 kg)     Body mass index is 38.05 kg/m.  Physical Exam:   Physical Exam  Constitutional: She is oriented to person, place, and time. She appears well-developed and well-nourished. No distress.  HENT:  Head: Normocephalic and atraumatic.  Right Ear: External ear normal.  Left Ear: External ear normal.  Nose: Nose normal.  Mouth/Throat: Oropharynx is clear and moist.  Eyes: Conjunctivae and EOM are normal. Pupils are equal, round, and reactive to light.  Neck: Normal range of motion. Neck supple. No thyromegaly present.  Cardiovascular: Normal rate, regular rhythm, normal heart sounds and intact distal pulses.  Pulmonary/Chest: Effort normal and breath sounds normal.  Abdominal: Soft. Bowel sounds are normal.  Musculoskeletal: Normal range of motion.  + Tinel's, Phalen bilaterally  Lymphadenopathy:    She has no cervical adenopathy.  Neurological: She is alert and oriented to person, place, and time.  Skin: Skin is warm and dry. Capillary refill takes less than 2 seconds.  Psychiatric: She has a normal mood and affect. [name].  Nursing note and vitals reviewed.   Assessment and Plan:   Current Problem List updated with notes today.  Patient Active Problem List   Diagnosis Date Noted  . Ankle edema, bilateral 06/16/2016  . Vitamin D deficiency 07/13/2015  . Morbid obesity (HCC) 04/09/2015   Alexandria Morrison was seen today for hand pain.  Diagnoses and all orders for this visit:  Bilateral carpal tunnel syndrome, R > L Comments: Patient has tried anti-inflammatories, stretches and exercises, and wrist brace at night.  Unfortunately, her symptoms are worsening.  Dr. Berline Chough is available to see the patient for further  evaluation.  Chronic pain of both ankles, walks daily for work Comments: Chronic wear and tear.  We discussed anti-inflammatories, stretches and exercises, and using wraps or braces as needed.  Obesity (BMI 30-39.9) Comments: Patient continues to lose weight.  She attributes this to walking more at work.  . Reviewed expectations re: course of current medical issues. . Discussed self-management of symptoms. . Outlined signs and symptoms indicating need for more acute intervention. . Patient verbalized understanding and all questions were answered. Marland Kitchen Health Maintenance issues including appropriate healthy diet, exercise, and smoking avoidance were discussed with patient. . See orders for this visit as documented in the electronic medical record. . Patient received an After Visit Summary.  CMA served as Neurosurgeon during this visit. History, Physical, and Plan performed by medical provider. The above documentation has been reviewed and is accurate and complete. Helane Rima, D.O.  Helane Rima, DO [place] 03/29/2017

## 2017-03-29 NOTE — Procedures (Signed)
LIMITED MSK ULTRASOUND OF BILATERAL WRISTS Images were obtained and interpreted by myself, Gaspar Bidding, DO  Images have been saved and stored to PACS system. Images obtained on: GE S7 Ultrasound machine  FINDINGS:   Right Median nerve = 0.19cm2  Left Median Nerve = 0.15cm2  IMPRESSION:  1. Bilateral carpal tunnel - Right Moderate to severe, Left mild to moderate

## 2017-03-29 NOTE — Assessment & Plan Note (Signed)
Fairly classic carpal tunnel syndrome.  We discussed multiple options and given the findings on ultrasound I think she would do well with ultrasound-guided 100 dissections initially.  She does have a return to work today and would like to come back when she is able to take a day off and we will come back at her convenience for bilateral ultrasound-guided touch of dissections.  We did discuss performing 1 extremity at a time but if she is taking day off I am happy to do both.  Recommend nighttime bracing and bracing with activities.  If any lack of improvement will need further diagnostic evaluation with EMGs and consideration of surgical release.

## 2017-03-29 NOTE — Addendum Note (Signed)
Addended by: Donnamarie Poag on: 03/29/2017 10:30 AM   Modules accepted: Orders

## 2017-03-29 NOTE — Progress Notes (Signed)
Alexandria Morrison. Alexandria [facility] at [facility] [phone]  Alexandria Morrison - 57 y.o. female MRN [id number]  Date of birth: [birth year]  Visit Date: 03/29/2017  PCP: Helane Rima, DO   Referred by: Helane Rima, DO   Scribe for today's visit: Stevenson Clinch, CMA     SUBJECTIVE:  Alexandria Morrison is here for New Patient (Initial Visit) (bilateral wrist pain) .  Referred by: Dr. Helane Rima Her bilateral wrist pain symptoms INITIALLY: Began about a year ago but has gotten worse over the past 7 months.  Described as moderate pins and needles, also sharp pains radiating to the hands, R>L.  Worsened with repetitive motions. She does packaging for a living.  Improved with rest, bracing.  Additional associated symptoms include: She denies swelling around the wrist. She denies numbness, tingling, weakness, decreased grip strength. She reports night time disturbance.     At this time symptoms are worsening compared to onset, increased frequency and intensity or pain.  She has been wearing wrist brace at night with some relief. She has also tried taking Aleve with minimal relief.    ROS Reports night time disturbances. Denies fevers, chills, or night sweats. Reports unexplained weight loss, 25 lbs over 7 months. Denies personal history of cancer. Denies changes in bowel or bladder habits. Denies recent unreported falls. Denies new or worsening dyspnea or wheezing. Denies headaches or dizziness.  Denies numbness, tingling or weakness  In the extremities.  Denies dizziness or presyncopal episodes Reports lower extremity edema    HISTORY & PERTINENT PRIOR DATA:  Prior History reviewed and updated per electronic medical record.  Significant history, findings, studies and interim changes include:  reports that  has never smoked. she has never used smokeless tobacco. Recent Labs    07/13/16 0849  HGBA1C 6.0   No specialty comments  available. Problem  Bilateral Carpal Tunnel Syndrome    OBJECTIVE:  VS:  HT:5\' 3"  (160 cm)   WT:214 lb 12.8 oz (97.4 kg)  BMI:38.06    BP:136/80  HR:86bpm  TEMP: ( )  RESP:96 %   PHYSICAL EXAM: Constitutional: WDWN, Non-toxic appearing. Psychiatric: Alert & appropriately interactive.  Not depressed or anxious appearing. Respiratory: No increased work of breathing.  Trachea Midline Eyes: Pupils are equal.  EOM intact without nystagmus.  No scleral icterus  NEUROVASCULAR exam: No clubbing or cyanosis appreciated No significant venous stasis changes Capillary Refill: normal, less than 2 seconds   Hands and wrists are overall well aligned.  She does have a mild amount of pain with carpal tunnel compression test.  Grip strength is normal.  She has no significant thenar atrophy.  Normal wrist flexion and extension.  No significant pain over the [facility] joints or IP joints.  No significant swelling of the DRUJ and no wrist effusion.   ASSESSMENT & PLAN:   1. Bilateral wrist pain   2. Bilateral carpal tunnel syndrome    ++++++++++++++++++++++++++++++++++++++++++++ Orders & Meds:  Orders Placed This Encounter  Procedures  . [place] MSK POCT ULTRASOUND   No orders of the defined types were placed in this encounter.   ++++++++++++++++++++++++++++++++++++++++++++ PLAN:    Bilateral carpal tunnel syndrome Fairly classic carpal tunnel syndrome.  We discussed multiple options and given the findings on ultrasound I think she would do well with ultrasound-guided 100 dissections initially.  She does have a return to work today and would like to come back when she is able to take a day off and  we will come back at her convenience for bilateral ultrasound-guided touch of dissections.  We did discuss performing 1 extremity at a time but if she is taking day off I am happy to do both.  Recommend nighttime bracing and bracing with activities.  If any lack of improvement will need further diagnostic  evaluation with EMGs and consideration of surgical release.   Follow-up: Return if symptoms worsen or fail to improve, for for bilateral carpal tunnel injections.   Pertinent documentation may be included in additional procedure notes, imaging studies, problem based documentation and patient instructions. Please see these sections of the encounter for additional information regarding this visit. CMA/ATC served as Neurosurgeon during this visit. History, Physical, and Plan performed by medical provider. Documentation and orders reviewed and attested to.      Andrena Mews, DO    [employer] Sports Medicine Physician

## 2017-04-07 ENCOUNTER — Ambulatory Visit: Payer: BLUE CROSS/BLUE SHIELD | Admitting: Sports Medicine

## 2017-04-07 ENCOUNTER — Encounter: Payer: Self-pay | Admitting: Sports Medicine

## 2017-04-07 ENCOUNTER — Ambulatory Visit: Payer: Self-pay

## 2017-04-07 VITALS — BP 136/80 | HR 74 | Ht 63.0 in | Wt 212.4 lb

## 2017-04-07 DIAGNOSIS — G5603 Carpal tunnel syndrome, bilateral upper limbs: Secondary | ICD-10-CM

## 2017-04-07 NOTE — Patient Instructions (Signed)

## 2017-04-07 NOTE — Progress Notes (Signed)
PROCEDURE NOTE:  Ultrasound Guided: Injection: Right Carpal Tunnel with circumferential hydrodissection Images were obtained and interpreted by myself, Gaspar Bidding, DO  Images have been saved and stored to PACS system. Images obtained on: GE S7 Ultrasound machine  ULTRASOUND FINDINGS:  Thickening of the Median Nerve as previously described  DESCRIPTION OF PROCEDURE:  The patient's clinical condition is marked by substantial pain and/or significant functional disability. Other conservative therapy has not provided relief, is contraindicated, or not appropriate. There is a reasonable likelihood that injection will significantly improve the patient's pain and/or functional impairment.  After discussing the risks, benefits and expected outcomes of the injection and all questions were reviewed and answered, the patient wished to undergo the above named procedure. Verbal consent was obtained.  The ultrasound was used to identify the target structure and adjacent neurovascular structures. The skin was then prepped in sterile fashion and the target structure was injected under direct visualization using sterile technique as below:  PREP: Alcohol, Ethel Chloride  APPROACH: direct, single injection, 25g 1.5in.  INJECTATE: 0.5cc: 1% lidocaine, 0.5cc: 0.5% marcaine, 0.5cc: 40mg /mL DepoMedrol   ASPIRATE: None   DRESSING: Band-Aid  Post procedural instructions including recommending icing and warning signs for infection were reviewed.   This procedure was well tolerated and there were no complications.   IMPRESSION: Succesful Ultrasound Guided: Injection

## 2017-04-07 NOTE — Progress Notes (Signed)
Alexandria Morrison. Alexandria Morrison [facility] at [facility] [phone]  Alexandria Morrison - 57 y.o. female MRN [id number]  Date of birth: 17-Nov-1960  Visit Date:   PCP: Helane Rima, DO   Referred by: Helane Rima, DO   Scribe for today's visit: Christoper Fabian, LAT, ATC     SUBJECTIVE:  Alexandria Morrison is here for Follow-up (B wrist pain) .   Notes from initial visit on 03/29/17: Her bilateral wrist pain symptoms INITIALLY: Began about a year ago but has gotten worse over the past 7 months.  Described as moderate pins and needles, also sharp pains radiating to the hands, R>L.  Worsened with repetitive motions. She does packaging for a living.  Improved with rest, bracing.  Additional associated symptoms include: She denies swelling around the wrist. She denies numbness, tingling, weakness, decreased grip strength. She reports night time disturbance.     At this time symptoms are worsening compared to onset, increased frequency and intensity or pain.  She has been wearing wrist brace at night with some relief. She has also tried taking Aleve with minimal relief.   04/07/17 Compared to the last office visit on 03/29/17, her previously described B wrist pain  symptoms show no change.  She states that she's noticed a bump that's developed on the dorsal aspect of her R index finger MCP joint. Current symptoms are moderate & are radiating to B hands R>L. She has been using Aleve prn and wears wrist braces at night.   ROS Reports night time disturbances. Denies fevers, chills, or night sweats. Reports unexplained weight loss. Denies personal history of cancer. Denies changes in bowel or bladder habits. Denies recent unreported falls. Denies new or worsening dyspnea or wheezing. Denies headaches or dizziness.  Reports numbness, tingling or weakness  In the extremities - B UEs Denies dizziness or presyncopal episodes Reports lower extremity edema - B  ankles   HISTORY & PERTINENT PRIOR DATA:  Prior History reviewed and updated per electronic medical record.  Significant history, findings, studies and interim changes include:  reports that she has never smoked. She has never used smokeless tobacco. Recent Labs    07/13/16 0849  HGBA1C 6.0   No specialty comments available. No problems updated.  OBJECTIVE:  VS:  HT:5\' 3"  (160 cm)   WT:212 lb 6.4 oz (96.3 kg)  BMI:37.63    BP:136/80  HR:74bpm  TEMP: ( )  RESP:96 %   PHYSICAL EXAM: Constitutional: WDWN, Non-toxic appearing. Psychiatric: Alert & appropriately interactive.  Not depressed or anxious appearing. Respiratory: No increased work of breathing.  Trachea Midline Eyes: Pupils are equal.  EOM intact without nystagmus.  No scleral icterus  NEUROVASCULAR exam: No clubbing or cyanosis appreciated No significant venous stasis changes Capillary Refill: normal, less than 2 seconds   No additional findings.   ASSESSMENT & PLAN:   1. Bilateral carpal tunnel syndrome    ++++++++++++++++++++++++++++++++++++++++++++ Orders & Meds:  Orders Placed This Encounter  Procedures  . [place] MSK POCT ULTRASOUND   No orders of the defined types were placed in this encounter.   ++++++++++++++++++++++++++++++++++++++++++++ PLAN:   Most symptomatic carpal tunnel injection performed today.  We will plan to check in on her progress in 2 weeks and can consider repeating an injection at that time. No problem-specific Assessment & Plan notes found for this encounter.   Follow-up: Return in about 2 weeks (around 04/21/2017).   Pertinent documentation may be included in additional procedure notes, imaging studies,  problem based documentation and patient instructions. Please see these sections of the encounter for additional information regarding this visit. CMA/ATC served as Neurosurgeon during this visit. History, Physical, and Plan performed by medical provider. Documentation and orders reviewed  and attested to.      Andrena Mews, DO    [facility] Physician

## 2017-04-21 ENCOUNTER — Ambulatory Visit: Payer: BLUE CROSS/BLUE SHIELD | Admitting: Sports Medicine

## 2017-04-25 ENCOUNTER — Telehealth: Payer: Self-pay | Admitting: Surgical

## 2017-04-25 NOTE — Telephone Encounter (Signed)
We received letter from [employer] that Cologuard order has been canceled.

## 2017-04-26 ENCOUNTER — Ambulatory Visit: Payer: BLUE CROSS/BLUE SHIELD | Admitting: Sports Medicine

## 2017-05-31 ENCOUNTER — Encounter: Payer: Self-pay | Admitting: Sports Medicine

## 2017-06-30 ENCOUNTER — Ambulatory Visit: Payer: BLUE CROSS/BLUE SHIELD | Admitting: Family Medicine

## 2017-06-30 DIAGNOSIS — M25572 Pain in left ankle and joints of left foot: Secondary | ICD-10-CM | POA: Diagnosis not present

## 2017-06-30 DIAGNOSIS — M25571 Pain in right ankle and joints of right foot: Secondary | ICD-10-CM | POA: Diagnosis not present

## 2017-07-25 DIAGNOSIS — M2141 Flat foot [pes planus] (acquired), right foot: Secondary | ICD-10-CM | POA: Diagnosis not present

## 2017-07-27 NOTE — Progress Notes (Signed)
Subjective:    Alexandria Morrison is a 57 y.o. female and is here for a comprehensive physical exam. See Assessment and Plan section for Problem Based Charting of issues discussed today.   Health Maintenance Due  Topic Date Due  . Hepatitis C Screening  1960/07/09  . HIV Screening  04/09/1975  . COLON CANCER SCREENING ANNUAL FOBT  04/09/2010    Current Outpatient Medications:  .  buPROPion (WELLBUTRIN XL) 150 MG 24 hr tablet, Take 1 tablet (150 mg total) by mouth daily., Disp: 30 tablet, Rfl: 3 .  gabapentin (NEURONTIN) 100 MG capsule, 1-2 TID prn pain., Disp: 180 capsule, Rfl: 3  PMHx, SurgHx, SocialHx, Medications, and Allergies were reviewed in the Visit Navigator and updated as appropriate.   History reviewed. No pertinent past medical history.   Past Surgical History:  Procedure Laterality Date  . ABDOMINAL HYSTERECTOMY    . TUBAL LIGATION       Family History  Problem Relation Age of Onset  . Hypertension Sister   . Diabetes Brother     Social History   Tobacco Use  . Smoking status: Never Smoker  . Smokeless tobacco: Never Used  Substance Use Topics  . Alcohol use: No    Alcohol/week: 0.0 oz  . Drug use: No    Review of Systems:   Pertinent items are noted in the HPI. Otherwise, ROS is negative.  Objective:   BP 136/80   Pulse 97   Temp 98.4 F (36.9 C) (Oral)   Ht 5\' 3"  (1.6 m)   Wt 227 lb 6.4 oz (103.1 kg)   SpO2 98%   BMI 40.28 kg/m   General appearance: alert, cooperative and appears stated age. Head: normocephalic, without obvious abnormality, atraumatic. Neck: no adenopathy, supple, symmetrical, trachea midline; thyroid not enlarged, symmetric, no tenderness/mass/nodules. Lungs: clear to auscultation bilaterally. Heart: regular rate and rhythm Abdomen: soft, non-tender; no masses,  no organomegaly. Extremities: extremities normal, atraumatic, no cyanosis or edema. Skin: skin color, texture, turgor normal, no rashes or lesions. Lymph:  cervical, supraclavicular, and axillary nodes normal; no abnormal inguinal nodes palpated. Neurologic: grossly normal.  Pelvic:  External genitalia: no lesions. Urethra: normal appearing urethra with no masses, tenderness or lesions. Bartholin's and Skene's: normal. Vagina: small introitus.                                      Assessment/Plan:   Alexandria Morrison was seen today for annual exam.  Diagnoses and all orders for this visit:  Routine physical examination  Insulin resistance -     CBC with Differential/Platelet -     Lipid panel -     Hemoglobin A1c  Pure hypercholesterolemia Comments: Due for lab check.  Elevated alkaline phosphatase level -     Comprehensive metabolic panel  Morbid obesity (HCC) Comments: Discussed healthy food choices and exercise.   Encounter for hepatitis C virus screening test for high risk patient -     Hepatitis C antibody  Screening for HIV (human immunodeficiency virus) -     HIV antibody  Screening for malignant neoplasm of colon Comments: Will order FOBT. Hard for her to get Cologaurd back to the lab.  Orders: -     Cancel: Fecal occult blood, imunochemical([facility]) -     Fecal occult blood, imunochemical; Future  Postherpetic neuralgia Comments: Restart gabapentin. Orders: -     gabapentin (NEURONTIN) 100 MG capsule; 1-2  TID prn pain.  Depression, major, single episode, moderate (HCC)    Office Visit from 07/28/2017 in [facility] Total Score  21     After discussion, patient would like to start below medication. Expectations, risks, and potential side effects reviewed.   -     buPROPion (WELLBUTRIN XL) 150 MG 24 hr tablet; Take 1 tablet (150 mg total) by mouth daily.  Chronic pain of right ankle Comments: Followed by Podiatry now. Recently had new insoles made at [facility]. Just started wearing them but optimistic. Pain has decreased exercise. Weight gain. Pain worst at work - stands and walks  all day on a concrete floor.   Malaise and fatigue Comments: See PHQ9. Will get sleep study if symptoms continue.   Postherpetic neuralgia -     gabapentin (NEURONTIN) 100 MG capsule; 1-2 TID prn pain.  Patient Counseling: [x]    Nutrition: Stressed importance of moderation in sodium/caffeine intake, saturated fat and cholesterol, caloric balance, sufficient intake of fresh fruits, vegetables, fiber, calcium, iron, and 1 mg of folate supplement per day (for females capable of pregnancy).  [x]    Stressed the importance of regular exercise.   [x]    Substance Abuse: Discussed cessation/primary prevention of tobacco, alcohol, or other drug use; driving or other dangerous activities under the influence; availability of treatment for abuse.   [x]    Injury prevention: Discussed safety belts, safety helmets, smoke detector, smoking near bedding or upholstery.   [x]    Sexuality: Discussed sexually transmitted diseases, partner selection, use of condoms, avoidance of unintended pregnancy  and contraceptive alternatives.  [x]    Dental health: Discussed importance of regular tooth brushing, flossing, and dental visits.  [x]    Health maintenance and immunizations reviewed. Please refer to Health maintenance section.   Helane Rima, DO [facility]

## 2017-07-28 ENCOUNTER — Ambulatory Visit (INDEPENDENT_AMBULATORY_CARE_PROVIDER_SITE_OTHER): Payer: BLUE CROSS/BLUE SHIELD | Admitting: Family Medicine

## 2017-07-28 ENCOUNTER — Encounter: Payer: Self-pay | Admitting: Family Medicine

## 2017-07-28 VITALS — BP 136/80 | HR 97 | Temp 98.4°F | Ht 63.0 in | Wt 227.4 lb

## 2017-07-28 DIAGNOSIS — Z9189 Other specified personal risk factors, not elsewhere classified: Secondary | ICD-10-CM

## 2017-07-28 DIAGNOSIS — E88819 Insulin resistance, unspecified: Secondary | ICD-10-CM

## 2017-07-28 DIAGNOSIS — E8881 Metabolic syndrome: Secondary | ICD-10-CM

## 2017-07-28 DIAGNOSIS — R5381 Other malaise: Secondary | ICD-10-CM

## 2017-07-28 DIAGNOSIS — G8929 Other chronic pain: Secondary | ICD-10-CM

## 2017-07-28 DIAGNOSIS — Z114 Encounter for screening for human immunodeficiency virus [HIV]: Secondary | ICD-10-CM

## 2017-07-28 DIAGNOSIS — F321 Major depressive disorder, single episode, moderate: Secondary | ICD-10-CM

## 2017-07-28 DIAGNOSIS — R748 Abnormal levels of other serum enzymes: Secondary | ICD-10-CM

## 2017-07-28 DIAGNOSIS — Z0000 Encounter for general adult medical examination without abnormal findings: Secondary | ICD-10-CM | POA: Diagnosis not present

## 2017-07-28 DIAGNOSIS — R5383 Other fatigue: Secondary | ICD-10-CM

## 2017-07-28 DIAGNOSIS — B0229 Other postherpetic nervous system involvement: Secondary | ICD-10-CM

## 2017-07-28 DIAGNOSIS — M25571 Pain in right ankle and joints of right foot: Secondary | ICD-10-CM

## 2017-07-28 DIAGNOSIS — Z1211 Encounter for screening for malignant neoplasm of colon: Secondary | ICD-10-CM

## 2017-07-28 DIAGNOSIS — Z1159 Encounter for screening for other viral diseases: Secondary | ICD-10-CM

## 2017-07-28 DIAGNOSIS — E7800 Pure hypercholesterolemia, unspecified: Secondary | ICD-10-CM | POA: Diagnosis not present

## 2017-07-28 LAB — CBC WITH DIFFERENTIAL/PLATELET
Basophils Absolute: 0.1 10*3/uL (ref 0.0–0.1)
Basophils Relative: 1.1 % (ref 0.0–3.0)
Eosinophils Absolute: 0.3 10*3/uL (ref 0.0–0.7)
Eosinophils Relative: 6.3 % — ABNORMAL HIGH (ref 0.0–5.0)
HCT: 34.7 % — ABNORMAL LOW (ref 36.0–46.0)
Hemoglobin: 11.8 g/dL — ABNORMAL LOW (ref 12.0–15.0)
Lymphocytes Relative: 28 % (ref 12.0–46.0)
Lymphs Abs: 1.4 10*3/uL (ref 0.7–4.0)
MCHC: 34 g/dL (ref 30.0–36.0)
MCV: 87.5 fl (ref 78.0–100.0)
Monocytes Absolute: 0.4 10*3/uL (ref 0.1–1.0)
Monocytes Relative: 7.7 % (ref 3.0–12.0)
Neutro Abs: 2.9 10*3/uL (ref 1.4–7.7)
Neutrophils Relative %: 56.9 % (ref 43.0–77.0)
Platelets: 220 10*3/uL (ref 150.0–400.0)
RBC: 3.96 Mil/uL (ref 3.87–5.11)
RDW: 14.8 % (ref 11.5–15.5)
WBC: 5.1 10*3/uL (ref 4.0–10.5)

## 2017-07-28 LAB — COMPREHENSIVE METABOLIC PANEL
ALT: 71 U/L — ABNORMAL HIGH (ref 0–35)
AST: 59 U/L — ABNORMAL HIGH (ref 0–37)
Albumin: 4.1 g/dL (ref 3.5–5.2)
Alkaline Phosphatase: 197 U/L — ABNORMAL HIGH (ref 39–117)
BUN: 13 mg/dL (ref 6–23)
CO2: 24 mEq/L (ref 19–32)
Calcium: 9.4 mg/dL (ref 8.4–10.5)
Chloride: 104 mEq/L (ref 96–112)
Creatinine, Ser: 0.8 mg/dL (ref 0.40–1.20)
GFR: 94.98 mL/min (ref >60.00)
Glucose, Bld: 123 mg/dL — ABNORMAL HIGH (ref 70–99)
Potassium: 3.7 mEq/L (ref 3.5–5.1)
Sodium: 140 mEq/L (ref 135–145)
Total Bilirubin: 0.4 mg/dL (ref 0.2–1.2)
Total Protein: 6.9 g/dL (ref 6.0–8.3)

## 2017-07-28 LAB — LIPID PANEL
Cholesterol: 261 mg/dL — ABNORMAL HIGH (ref 0–200)
HDL: 103.2 mg/dL (ref >39.00)
LDL Cholesterol: 131 mg/dL — ABNORMAL HIGH (ref 0–99)
NonHDL: 157.72
Total CHOL/HDL Ratio: 3
Triglycerides: 134 mg/dL (ref 0.0–149.0)
VLDL: 26.8 mg/dL (ref 0.0–40.0)

## 2017-07-28 LAB — HEMOGLOBIN A1C: Hgb A1c MFr Bld: 5.9 % (ref 4.6–6.5)

## 2017-07-28 MED ORDER — GABAPENTIN 100 MG PO CAPS
ORAL_CAPSULE | ORAL | 3 refills | Status: DC
Start: 2017-07-28 — End: 2018-05-14

## 2017-07-28 MED ORDER — BUPROPION HCL ER (XL) 150 MG PO TB24
150.0000 mg | ORAL_TABLET | Freq: Every day | ORAL | 3 refills | Status: DC
Start: 2017-07-28 — End: 2018-05-14

## 2017-07-29 LAB — HIV ANTIBODY (ROUTINE TESTING W REFLEX): HIV 1&2 Ab, 4th Generation: NONREACTIVE

## 2017-07-29 LAB — HEPATITIS C ANTIBODY
Hepatitis C Ab: NONREACTIVE
SIGNAL TO CUT-OFF: 0.01 (ref <1.00)

## 2017-07-31 ENCOUNTER — Other Ambulatory Visit: Payer: Self-pay

## 2017-07-31 DIAGNOSIS — R748 Abnormal levels of other serum enzymes: Secondary | ICD-10-CM

## 2017-08-16 ENCOUNTER — Ambulatory Visit
Admission: RE | Admit: 2017-08-16 | Discharge: 2017-08-16 | Disposition: A | Discharge status: Home / Self Care | Payer: BLUE CROSS/BLUE SHIELD | Source: Ambulatory Visit | Attending: Family Medicine | Admitting: Family Medicine

## 2017-08-16 DIAGNOSIS — R748 Abnormal levels of other serum enzymes: Secondary | ICD-10-CM

## 2017-08-16 DIAGNOSIS — R7989 Other specified abnormal findings of blood chemistry: Secondary | ICD-10-CM | POA: Diagnosis not present

## 2018-05-13 NOTE — Progress Notes (Addendum)
Virtual Visit via Video   I connected with Alexandria Morrison by a video enabled telemedicine application and verified that I am speaking with the correct person using two identifiers. Location patient: Home Location provider: [facility], Office Persons participating in the virtual visit: Penina Soder, Helane Rima, DO Barnie Mort, CMA acting as scribe for Dr. Helane Rima.   I discussed the limitations of evaluation and management by telemedicine and the availability of in person appointments. The patient expressed understanding and agreed to proceed.  Subjective:   HPI: Patient in for follow up. She has not been seen in 9 months. She has been having some swelling in both feet and ankles. She is not working right now due to Covid-19 and she is still swelling. She has pain in feet in the morning until she starts moving.   She has not had any alcohol recently.   Lab Results  Component Value Date   CHOL 261 (H) 07/28/2017   HDL 103.20 07/28/2017   LDLCALC 131 (H) 07/28/2017   TRIG 134.0 07/28/2017   CHOLHDL 3 07/28/2017   Lab Results  Component Value Date   HGBA1C 5.9 07/28/2017   Lab Results  Component Value Date   ALT 71 (H) 07/28/2017   AST 59 (H) 07/28/2017   ALKPHOS 197 (H) 07/28/2017   BILITOT 0.4 07/28/2017   Wt Readings from Last 3 Encounters:  05/14/18 227 lb (103 kg)  07/28/17 227 lb 6.4 oz (103.1 kg)  04/07/17 212 lb 6.4 oz (96.3 kg)   BP Readings from Last 3 Encounters:  07/28/17 136/80  04/07/17 136/80  03/29/17 136/80   Review of Systems  Constitutional: Negative for chills, fever, malaise/fatigue and weight loss.  Respiratory: Negative for cough, shortness of breath and wheezing.   Cardiovascular: Positive for leg swelling. Negative for chest pain and palpitations.  Gastrointestinal: Negative for abdominal pain, constipation, diarrhea, nausea and vomiting.  Genitourinary: Negative for dysuria and urgency.  Musculoskeletal: Negative for joint pain  and myalgias.  Skin: Negative for rash.  Neurological: Negative for dizziness and headaches.  Psychiatric/Behavioral: Negative for depression, substance abuse and suicidal ideas. The patient is not nervous/anxious.    Patient Active Problem List   Diagnosis Date Noted  . Bilateral carpal tunnel syndrome 03/29/2017  . Ankle edema, bilateral 06/16/2016  . Vitamin D deficiency 07/13/2015  . Morbid obesity (HCC) 04/09/2015    Social History   Tobacco Use  . Smoking status: Never Smoker  . Smokeless tobacco: Never Used  Substance Use Topics  . Alcohol use: No    Alcohol/week: 0.0 standard drinks    Current Outpatient Medications:  .  hydrochlorothiazide (HYDRODIURIL) 25 MG tablet, Take 1 tablet (25 mg total) by mouth daily., Disp: 90 tablet, Rfl: 3 .  potassium chloride (K-DUR) 10 MEQ tablet, Take 1 tablet (10 mEq total) by mouth daily., Disp: 30 tablet, Rfl: 0  No Known Allergies  Objective:   VITALS: Per patient if applicable, see vitals. GENERAL: Alert, appears well and in no acute distress. HEENT: Atraumatic, conjunctiva clear, no obvious abnormalities on inspection of external nose and ears. NECK: Normal movements of the head and neck. CARDIOPULMONARY: No increased WOB. Speaking in clear sentences. I:E ratio WNL.  MS: Moves all visible extremities without noticeable abnormality. PSYCH: Pleasant and cooperative, well-groomed. Speech normal rate and rhythm. Affect is appropriate. Insight and judgement are appropriate. Attention is focused, linear, and appropriate.  NEURO: CN grossly intact. Oriented as arrived to appointment on time with no prompting. Moves both  UE equally.  SKIN: No obvious lesions, wounds, erythema, or cyanosis noted on face or hands.  Assessment and Plan:   Leta JunglingMarcia was seen today for follow-up.  Diagnoses and all orders for this visit:  Ankle edema, bilateral Comments: Stable. Previously thought to be due to standing at work all day. Will trial  diuretic. Orders: -     hydrochlorothiazide (HYDRODIURIL) 25 MG tablet; Take 1 tablet (25 mg total) by mouth daily. -     potassium chloride (K-DUR) 10 MEQ tablet; Take 1 tablet (10 mEq total) by mouth daily.  Insulin resistance Comments: Labs at next visit. No signs of worsening.  Morbid obesity (HCC) Comments: Stable. Not moving as much since at home. Advised daily exercise stop deconditioning.    . Reviewed expectations re: course of current medical issues. . Discussed self-management of symptoms. . Outlined signs and symptoms indicating need for more acute intervention. . Patient verbalized understanding and all questions were answered. Marland Kitchen. Health Maintenance issues including appropriate healthy diet, exercise, and smoking avoidance were discussed with patient. . See orders for this visit as documented in the electronic medical record.  Helane RimaErica Ferman Basilio, DO  Records requested if needed. Time spent: 25 minutes, of which >50% was spent in obtaining information about her symptoms, reviewing her previous labs, evaluations, and treatments, counseling her about her condition (please see the discussed topics above), and developing a plan to further investigate it; she had a number of questions which I addressed.

## 2018-05-14 ENCOUNTER — Ambulatory Visit (INDEPENDENT_AMBULATORY_CARE_PROVIDER_SITE_OTHER): Payer: 59 | Admitting: Family Medicine

## 2018-05-14 ENCOUNTER — Other Ambulatory Visit: Payer: Self-pay

## 2018-05-14 ENCOUNTER — Encounter: Payer: Self-pay | Admitting: Family Medicine

## 2018-05-14 VITALS — Ht 63.0 in | Wt 227.0 lb

## 2018-05-14 DIAGNOSIS — E88819 Insulin resistance, unspecified: Secondary | ICD-10-CM

## 2018-05-14 DIAGNOSIS — M25472 Effusion, left ankle: Secondary | ICD-10-CM | POA: Diagnosis not present

## 2018-05-14 DIAGNOSIS — M25471 Effusion, right ankle: Secondary | ICD-10-CM

## 2018-05-14 DIAGNOSIS — E8881 Metabolic syndrome: Secondary | ICD-10-CM | POA: Diagnosis not present

## 2018-05-14 MED ORDER — POTASSIUM CHLORIDE ER 10 MEQ PO TBCR: 10.0000 meq | EXTENDED_RELEASE_TABLET | Freq: Every day | ORAL | 0 refills | Status: DC

## 2018-05-14 MED ORDER — HYDROCHLOROTHIAZIDE 25 MG PO TABS: 25.0000 mg | ORAL_TABLET | Freq: Every day | ORAL | 3 refills | Status: DC

## 2018-05-17 ENCOUNTER — Encounter: Payer: Self-pay | Admitting: Family Medicine

## 2018-08-01 ENCOUNTER — Encounter: Payer: 59 | Admitting: Family Medicine

## 2018-09-07 ENCOUNTER — Encounter: Payer: 59 | Admitting: Family Medicine

## 2020-03-10 DIAGNOSIS — Z23 Encounter for immunization: Secondary | ICD-10-CM | POA: Diagnosis not present

## 2020-08-28 ENCOUNTER — Encounter (HOSPITAL_BASED_OUTPATIENT_CLINIC_OR_DEPARTMENT_OTHER): Payer: Self-pay

## 2020-08-28 ENCOUNTER — Emergency Department (HOSPITAL_BASED_OUTPATIENT_CLINIC_OR_DEPARTMENT_OTHER)
Admission: EM | Admit: 2020-08-28 | Discharge: 2020-08-29 | Disposition: A | Discharge status: Home / Self Care | Payer: BC Managed Care – PPO | Attending: Emergency Medicine | Admitting: Emergency Medicine

## 2020-08-28 ENCOUNTER — Other Ambulatory Visit: Payer: Self-pay

## 2020-08-28 DIAGNOSIS — R600 Localized edema: Secondary | ICD-10-CM | POA: Diagnosis not present

## 2020-08-28 DIAGNOSIS — I83028 Varicose veins of left lower extremity with ulcer other part of lower leg: Secondary | ICD-10-CM

## 2020-08-28 DIAGNOSIS — R609 Edema, unspecified: Secondary | ICD-10-CM

## 2020-08-28 DIAGNOSIS — M79604 Pain in right leg: Secondary | ICD-10-CM | POA: Diagnosis not present

## 2020-08-28 DIAGNOSIS — L97921 Non-pressure chronic ulcer of unspecified part of left lower leg limited to breakdown of skin: Secondary | ICD-10-CM | POA: Insufficient documentation

## 2020-08-28 DIAGNOSIS — M5431 Sciatica, right side: Secondary | ICD-10-CM | POA: Diagnosis not present

## 2020-08-28 DIAGNOSIS — I872 Venous insufficiency (chronic) (peripheral): Secondary | ICD-10-CM | POA: Diagnosis not present

## 2020-08-28 LAB — CBC WITH DIFFERENTIAL/PLATELET
Abs Immature Granulocytes: 0.02 10*3/uL (ref 0.00–0.07)
Basophils Absolute: 0.1 10*3/uL (ref 0.0–0.1)
Basophils Relative: 1 %
Eosinophils Absolute: 0.4 10*3/uL (ref 0.0–0.5)
Eosinophils Relative: 6 %
HCT: 28.6 % — ABNORMAL LOW (ref 36.0–46.0)
Hemoglobin: 9.6 g/dL — ABNORMAL LOW (ref 12.0–15.0)
Immature Granulocytes: 0 %
Lymphocytes Relative: 16 %
Lymphs Abs: 1.1 10*3/uL (ref 0.7–4.0)
MCH: 28.5 pg (ref 26.0–34.0)
MCHC: 33.6 g/dL (ref 30.0–36.0)
MCV: 84.9 fL (ref 80.0–100.0)
Monocytes Absolute: 0.8 10*3/uL (ref 0.1–1.0)
Monocytes Relative: 11 %
Neutro Abs: 4.4 10*3/uL (ref 1.7–7.7)
Neutrophils Relative %: 66 %
Platelets: 270 10*3/uL (ref 150–400)
RBC: 3.37 MIL/uL — ABNORMAL LOW (ref 3.87–5.11)
RDW: 13.9 % (ref 11.5–15.5)
WBC: 6.7 10*3/uL (ref 4.0–10.5)
nRBC: 0 % (ref 0.0–0.2)

## 2020-08-28 LAB — BASIC METABOLIC PANEL
Anion gap: 10 (ref 5–15)
BUN: 12 mg/dL (ref 6–20)
CO2: 25 mmol/L (ref 22–32)
Calcium: 8.9 mg/dL (ref 8.9–10.3)
Chloride: 104 mmol/L (ref 98–111)
Creatinine, Ser: 0.86 mg/dL (ref 0.44–1.00)
GFR, Estimated: >60 mL/min (ref >60)
Glucose, Bld: 98 mg/dL (ref 70–99)
Potassium: 3.3 mmol/L — ABNORMAL LOW (ref 3.5–5.1)
Sodium: 139 mmol/L (ref 135–145)

## 2020-08-28 LAB — BRAIN NATRIURETIC PEPTIDE: B Natriuretic Peptide: 72 pg/mL (ref 0.0–100.0)

## 2020-08-28 NOTE — ED Triage Notes (Addendum)
Pt is present for bilateral leg swelling x 1.5 month with blistering and weeping. Pt states "my skin feels really tight and getting painful to walk". Pt also states two days ago she started to have a sharp pain that starts at her right hip and radiates down her leg. Bandages in place of weeping. Pt does not take any medications for sx and has not seen a PCP for over two years.

## 2020-08-29 ENCOUNTER — Emergency Department (HOSPITAL_BASED_OUTPATIENT_CLINIC_OR_DEPARTMENT_OTHER): Payer: BC Managed Care – PPO

## 2020-08-29 DIAGNOSIS — R600 Localized edema: Secondary | ICD-10-CM | POA: Diagnosis not present

## 2020-08-29 DIAGNOSIS — M7989 Other specified soft tissue disorders: Secondary | ICD-10-CM | POA: Diagnosis not present

## 2020-08-29 DIAGNOSIS — I517 Cardiomegaly: Secondary | ICD-10-CM | POA: Diagnosis not present

## 2020-08-29 MED ORDER — FUROSEMIDE 10 MG/ML IJ SOLN
20.0000 mg | Freq: Once | INTRAMUSCULAR | Status: AC
  Administered 2020-08-29: 20 mg via INTRAVENOUS
  Filled 2020-08-29: qty 2

## 2020-08-29 MED ORDER — POTASSIUM CHLORIDE CRYS ER 20 MEQ PO TBCR
40.0000 meq | EXTENDED_RELEASE_TABLET | Freq: Once | ORAL | Status: AC
  Administered 2020-08-29: 40 meq via ORAL
  Filled 2020-08-29: qty 2

## 2020-08-29 MED ORDER — SILVER SULFADIAZINE 1 % EX CREA
TOPICAL_CREAM | Freq: Two times a day (BID) | CUTANEOUS | Status: DC
  Administered 2020-08-29: 1 via TOPICAL
  Filled 2020-08-29: qty 85

## 2020-08-29 MED ORDER — POTASSIUM CHLORIDE CRYS ER 20 MEQ PO TBCR: 20.0000 meq | EXTENDED_RELEASE_TABLET | Freq: Every day | ORAL | 0 refills | Status: DC

## 2020-08-29 MED ORDER — FUROSEMIDE 20 MG PO TABS: 20.0000 mg | ORAL_TABLET | Freq: Every day | ORAL | 0 refills | Status: DC

## 2020-08-29 MED ORDER — HYDROCODONE-ACETAMINOPHEN 5-325 MG PO TABS: 1.0000 | ORAL_TABLET | ORAL | 0 refills | Status: DC | PRN

## 2020-08-29 NOTE — ED Notes (Signed)
This RN presented the AVS utilizing Teachback Method. Patient verbalizes understanding of Discharge Instructions. Opportunity for Questioning and Answers were provided. Patient Discharged from ED ambulatory to Home.   

## 2020-08-29 NOTE — ED Provider Notes (Signed)
[facility] EMERGENCY DEPT Provider Note   CSN: [id number] Arrival date & time: 08/28/20  2022     History Chief Complaint  Patient presents with   Leg Swelling   Blister    Alexandria Morrison is a 60 y.o. female.  Patient presents to the emergency department for evaluation of swelling of her legs.  Patient reports that she first noticed the swelling 1-1/2 months ago.  Legs have been persistently swollen since that time and have progressively worsened.  No history of congestive heart failure.  She does not take a diuretic.  She reports that her doctor left the group and she has not been able to be seen.  Patient has not had any chest pain or shortness of breath.  She has noticed a small wound on the outside portion of the left lower leg that is weeping clear fluid.  She has been using antibiotic ointment on the area.  Patient also complains of right leg pain.  She has having pain from her hip down her leg that worsens when she tries to raise the leg such as walking up steps.  She denies any injury.      No past medical history on file.  Patient Active Problem List   Diagnosis Date Noted   Bilateral carpal tunnel syndrome 03/29/2017   Ankle edema, bilateral 06/16/2016   Vitamin D deficiency 07/13/2015   Morbid obesity (HCC) 04/09/2015    Past Surgical History:  Procedure Laterality Date   ABDOMINAL HYSTERECTOMY     TUBAL LIGATION       OB History   No obstetric history on file.     Family History  Problem Relation Age of Onset   Hypertension Sister    Diabetes Brother     Social History   Tobacco Use   Smoking status: Never   Smokeless tobacco: Never  Substance Use Topics   Alcohol use: No    Alcohol/week: 0.0 standard drinks   Drug use: No    Home Medications Prior to Admission medications   Medication Sig Start Date End Date Taking? Authorizing Provider  furosemide (LASIX) 20 MG tablet Take 1 tablet (20 mg total) by mouth daily. 08/29/20  Yes  Faron Whitelock, Canary Brim, MD  HYDROcodone-acetaminophen (NORCO/VICODIN) 5-325 MG tablet Take 1 tablet by mouth every 4 (four) hours as needed for moderate pain. 08/29/20  Yes Naftoli Penny, Canary Brim, MD  potassium chloride SA (KLOR-CON) 20 MEQ tablet Take 1 tablet (20 mEq total) by mouth daily. 08/29/20  Yes Nathaniel Yaden, Canary Brim, MD  hydrochlorothiazide (HYDRODIURIL) 25 MG tablet Take 1 tablet (25 mg total) by mouth daily. 05/14/18   Helane Rima, DO  potassium chloride (K-DUR) 10 MEQ tablet Take 1 tablet (10 mEq total) by mouth daily. 05/14/18   Helane Rima, DO    Allergies    Patient has no known allergies.  Review of Systems   Review of Systems  Cardiovascular:  Positive for leg swelling.  Musculoskeletal:  Positive for back pain.  Skin:  Positive for wound.  All other systems reviewed and are negative.  Physical Exam Updated Vital Signs BP (!) 148/100 (BP Location: Right Arm)   Pulse 66   Temp 98 F (36.7 C) (Oral)   Resp 16   Ht 5\' 2"  (1.575 m)   Wt 111.1 kg   SpO2 98%   BMI 44.81 kg/m   Physical Exam Vitals and nursing note reviewed.  Constitutional:      General: She is not in acute distress.  Appearance: Normal appearance. She is well-developed.  HENT:     Head: Normocephalic and atraumatic.     Right Ear: Hearing normal.     Left Ear: Hearing normal.     Nose: Nose normal.  Eyes:     Conjunctiva/sclera: Conjunctivae normal.     Pupils: Pupils are equal, round, and reactive to light.  Cardiovascular:     Rate and Rhythm: Regular rhythm.     Heart sounds: S1 normal and S2 normal. No murmur heard.   No friction rub. No gallop.  Pulmonary:     Effort: Pulmonary effort is normal. No respiratory distress.     Breath sounds: Normal breath sounds.  Chest:     Chest wall: No tenderness.  Abdominal:     General: Bowel sounds are normal.     Palpations: Abdomen is soft.     Tenderness: There is no abdominal tenderness. There is no guarding or rebound. Negative  signs include Murphy's sign and McBurney's sign.     Hernia: No hernia is present.  Musculoskeletal:     Cervical back: Normal range of motion and neck supple.     Lumbar back: Positive right straight leg raise test. Negative left straight leg raise test.  Skin:    General: Skin is warm and dry.     Findings: Wound (5 mm opening lateral aspect of distal left lower leg without erythema, induration, fluctuance.  No drainage currently.) present. No rash.  Neurological:     Mental Status: She is alert and oriented to person, place, and time.     GCS: GCS eye subscore is 4. GCS verbal subscore is 5. GCS motor subscore is 6.     Cranial Nerves: No cranial nerve deficit.     Sensory: No sensory deficit.     Coordination: Coordination normal.  Psychiatric:        Speech: Speech normal.        Behavior: Behavior normal.        Thought Content: Thought content normal.    ED Results / Procedures / Treatments   Labs (all labs ordered are listed, but only abnormal results are displayed) Labs Reviewed  CBC WITH DIFFERENTIAL/PLATELET - Abnormal; Notable for the following components:      Result Value   RBC 3.37 (*)    Hemoglobin 9.6 (*)    HCT 28.6 (*)    All other components within normal limits  BASIC METABOLIC PANEL - Abnormal; Notable for the following components:   Potassium 3.3 (*)    All other components within normal limits  BRAIN NATRIURETIC PEPTIDE    EKG None  Radiology DG Chest Port 1 View  Result Date: 08/29/2020 CLINICAL DATA:  Bilateral leg swelling EXAM: PORTABLE CHEST 1 VIEW COMPARISON:  None. FINDINGS: Cardiomegaly, vascular congestion. No confluent opacities or overt edema. No effusions or acute bony abnormality. IMPRESSION: Cardiomegaly, vascular congestion. Electronically Signed   By: Charlett Nose M.D.   On: 08/29/2020 00:46    Procedures Procedures   Medications Ordered in ED Medications  silver sulfADIAZINE (SILVADENE) 1 % cream (1 application Topical Given  08/29/20 0258)  furosemide (LASIX) injection 20 mg (20 mg Intravenous Given 08/29/20 0052)  potassium chloride SA (KLOR-CON) CR tablet 40 mEq (40 mEq Oral Given 08/29/20 0053)    ED Course  I have reviewed the triage vital signs and the nursing notes.  Pertinent labs & imaging results that were available during my care of the patient were reviewed by me and considered  in my medical decision making (see chart for details).    MDM Rules/Calculators/A&P                           Patient with peripheral edema of both lower legs has been ongoing for nearly 2 months.  She does have some chronic venous stasis changes of the legs and I do see a previous diagnosis of ankle edema from 2018 in her chart.  She likely has some element of chronic edema now worsened for unclear reasons.  No findings that would suggest active congestive heart failure.  Will initiate diuresis.  Patient complaining of right hip and leg pain.  This is consistent with an acute sciatica.  She has normal strength but painful inhibition with raising the right leg.  There is mildly positive straight leg raise on the right.  No red flags to suggest an acute neurosurgical emergency.  Final Clinical Impression(s) / ED Diagnoses Final diagnoses:  Sciatica of right side  Peripheral edema  Venous stasis ulcer of other part of left lower leg limited to breakdown of skin, unspecified whether varicose veins present [facility])    Rx / DC Orders ED Discharge Orders          Ordered    furosemide (LASIX) 20 MG tablet  Daily        08/29/20 0235    potassium chloride SA (KLOR-CON) 20 MEQ tablet  Daily        08/29/20 0235    HYDROcodone-acetaminophen (NORCO/VICODIN) 5-325 MG tablet  Every 4 hours PRN        08/29/20 0521             Gilda Crease, MD 08/29/20 0700

## 2020-08-29 NOTE — Discharge Instructions (Addendum)
Take the furosemide for the next 5 days.  Do not take your hydrochlorothiazide while you are taking.  Make sure you take potassium while taking the new diuretic.  Schedule follow-up with primary care for a recheck.

## 2020-09-01 ENCOUNTER — Ambulatory Visit (INDEPENDENT_AMBULATORY_CARE_PROVIDER_SITE_OTHER): Payer: BC Managed Care – PPO | Admitting: Internal Medicine

## 2020-09-01 ENCOUNTER — Encounter: Payer: Self-pay | Admitting: Internal Medicine

## 2020-09-01 ENCOUNTER — Other Ambulatory Visit: Payer: Self-pay

## 2020-09-01 DIAGNOSIS — I10 Essential (primary) hypertension: Secondary | ICD-10-CM | POA: Diagnosis not present

## 2020-09-01 DIAGNOSIS — R600 Localized edema: Secondary | ICD-10-CM | POA: Diagnosis not present

## 2020-09-01 DIAGNOSIS — M25472 Effusion, left ankle: Secondary | ICD-10-CM

## 2020-09-01 DIAGNOSIS — M5441 Lumbago with sciatica, right side: Secondary | ICD-10-CM | POA: Diagnosis not present

## 2020-09-01 DIAGNOSIS — M25471 Effusion, right ankle: Secondary | ICD-10-CM

## 2020-09-01 MED ORDER — HYDROCHLOROTHIAZIDE 25 MG PO TABS: 25.0000 mg | ORAL_TABLET | Freq: Every day | ORAL | 1 refills | Status: DC

## 2020-09-01 MED ORDER — MELOXICAM 7.5 MG PO TABS: 7.5000 mg | ORAL_TABLET | Freq: Every day | ORAL | 0 refills | Status: AC

## 2020-09-01 NOTE — Progress Notes (Signed)
Established Patient Office Visit     This visit occurred during the SARS-CoV-2 public health emergency.  Safety protocols were in place, including screening questions prior to the visit, additional usage of staff PPE, and extensive cleaning of exam room while observing appropriate contact time as indicated for disinfecting solutions.    CC/Reason for Visit: Emergency department follow-up  HPI: Alexandria Morrison is a 60 y.o. female who is coming in today for the above mentioned reasons.  She has not had routine medical care in over 2 years.  She visited the emergency department on August 5 with bilateral lower extremity edema and what sounded like right-sided sciatica.  She was given some hydrocodone, furosemide and potassium and discharged home.  She is here today for an emergency department follow-up.  She needs to establish care with a new primary care provider.  She continues to have bilateral lower extremity edema, she denies chest pain or dyspnea on exertion.  She has pain in her right lower back, above her buttock that extends down the back and lateral side of her thigh down into her foot.  Past Medical/Surgical History: No past medical history on file.  Past Surgical History:  Procedure Laterality Date   ABDOMINAL HYSTERECTOMY     TUBAL LIGATION      Social History:  reports that she has never smoked. She has never used smokeless tobacco. She reports that she does not drink alcohol and does not use drugs.  Allergies: No Known Allergies  Family History:  Family History  Problem Relation Age of Onset   Hypertension Sister    Diabetes Brother      Current Outpatient Medications:    furosemide (LASIX) 20 MG tablet, Take 1 tablet (20 mg total) by mouth daily., Disp: 5 tablet, Rfl: 0   HYDROcodone-acetaminophen (NORCO/VICODIN) 5-325 MG tablet, Take 1 tablet by mouth every 4 (four) hours as needed for moderate pain., Disp: 10 tablet, Rfl: 0   meloxicam (MOBIC) 7.5 MG tablet,  Take 1 tablet (7.5 mg total) by mouth daily for 10 days., Disp: 10 tablet, Rfl: 0   potassium chloride SA (KLOR-CON) 20 MEQ tablet, Take 1 tablet (20 mEq total) by mouth daily., Disp: 7 tablet, Rfl: 0   hydrochlorothiazide (HYDRODIURIL) 25 MG tablet, Take 1 tablet (25 mg total) by mouth daily., Disp: 90 tablet, Rfl: 1   potassium chloride (K-DUR) 10 MEQ tablet, Take 1 tablet (10 mEq total) by mouth daily. (Patient not taking: Reported on 09/01/2020), Disp: 30 tablet, Rfl: 0  Review of Systems:  Constitutional: Denies fever, chills, diaphoresis, appetite change and fatigue.  HEENT: Denies photophobia, eye pain, redness, hearing loss, ear pain, congestion, sore throat, rhinorrhea, sneezing, mouth sores, trouble swallowing, neck pain, neck stiffness and tinnitus.   Respiratory: Denies SOB, DOE, cough, chest tightness,  and wheezing.   Cardiovascular: Denies chest pain, palpitations. Gastrointestinal: Denies nausea, vomiting, abdominal pain, diarrhea, constipation, blood in stool and abdominal distention.  Genitourinary: Denies dysuria, urgency, frequency, hematuria, flank pain and difficulty urinating.  Endocrine: Denies: hot or cold intolerance, sweats, changes in hair or nails, polyuria, polydipsia. Musculoskeletal: Denies myalgias, back pain, joint swelling, arthralgias and gait problem.  Skin: Denies pallor, rash and wound.  Neurological: Denies dizziness, seizures, syncope, weakness, light-headedness, numbness and headaches.  Hematological: Denies adenopathy. Easy bruising, personal or family bleeding history  Psychiatric/Behavioral: Denies suicidal ideation, mood changes, confusion, nervousness, sleep disturbance and agitation    Physical Exam: Vitals:   09/01/20 0758  BP: 140/90  Pulse:  80  Temp: 98.7 F (37.1 C)  TempSrc: Oral  SpO2: 94%  Weight: 230 lb 3.2 oz (104.4 kg)  Height: 5\' 2"  (1.575 m)    Body mass index is 42.1 kg/m.   Constitutional: NAD, calm, comfortable,  obese Eyes: PERRL, lids and conjunctivae normal ENMT: Mucous membranes are moist.  Respiratory: clear to auscultation bilaterally, no wheezing, no crackles. Normal respiratory effort. No accessory muscle use.  Cardiovascular: Regular rate and rhythm, no murmurs / rubs / gallops.  3+ pitting bilateral lower extremity edema.  Neurologic: Grossly intact and nonfocal Psychiatric: Normal judgment and insight. Alert and oriented x 3. Normal mood.    Impression and Plan:  Morbid obesity (HCC) -Discussed healthy lifestyle, including increased physical activity and better food choices to promote weight loss.  Primary hypertension -I will make this diagnosis today. -Suspect lower extremity edema is related to this and also possibly diastolic heart failure. -For now we will start on hydrochlorothiazide 25 mg daily for blood pressure control. -She will need follow-up in 6 to 8 weeks with her new primary care provider.  Bilateral lower extremity edema -See above.  Acute right-sided low back pain with right-sided sciatica  - Plan: meloxicam (MOBIC) 7.5 MG tablet -Have also advised icing, daily back stretches that she has been provided with.  Weight loss will be a long-term goal.  If fails to improve can consider referral to physical therapy.   Time spent: 31 minutes reviewing chart, interviewing and examining patient and formulating plan of care.   Patient Instructions  -Nice seeing you today!!  -Start HCTZ 25 mg daily for your blood pressure.  -For your back: meloxicam 1 tablet daily for 10 days, icing and back stretches daily.  -Please arrange follow up with a new PCP in 6-8 weeks.    , MD  [facility]

## 2020-09-01 NOTE — Patient Instructions (Signed)
-  Nice seeing you today!!  -Start HCTZ 25 mg daily for your blood pressure.  -For your back: meloxicam 1 tablet daily for 10 days, icing and back stretches daily.  -Please arrange follow up with a new PCP in 6-8 weeks.

## 2020-09-02 ENCOUNTER — Telehealth: Payer: Self-pay

## 2020-09-02 NOTE — Telephone Encounter (Signed)
PT called to advise that the pain meds that were prescribed by Dr.Hernandez don't work and would like to talk to the Dr about what to do.

## 2020-09-02 NOTE — Telephone Encounter (Signed)
Spoke with patient and gave Dr Hardie Shackleton recommendations.  Patient also request information regarding [facility] urgent [facility].

## 2020-09-29 ENCOUNTER — Telehealth (INDEPENDENT_AMBULATORY_CARE_PROVIDER_SITE_OTHER): Payer: BC Managed Care – PPO | Admitting: Family Medicine

## 2020-09-29 ENCOUNTER — Encounter: Payer: Self-pay | Admitting: Family Medicine

## 2020-09-29 DIAGNOSIS — U071 COVID-19: Secondary | ICD-10-CM | POA: Diagnosis not present

## 2020-09-29 MED ORDER — BENZONATATE 100 MG PO CAPS: 100.0000 mg | ORAL_CAPSULE | Freq: Three times a day (TID) | ORAL | 0 refills | Status: DC | PRN

## 2020-09-29 MED ORDER — NIRMATRELVIR/RITONAVIR (PAXLOVID)TABLET: 3.0000 | ORAL_TABLET | Freq: Two times a day (BID) | ORAL | 0 refills | Status: AC

## 2020-09-29 NOTE — Progress Notes (Signed)
Virtual Visit via Video Note  I connected with Alexandria Morrison  on 09/29/20 at  4:20 PM EDT by a video enabled telemedicine application and verified that I am speaking with the correct person using two identifiers.  Location patient: home, [place] Location provider:work or home office Persons participating in the virtual visit: patient, provider  I discussed the limitations of evaluation and management by telemedicine and the availability of in person appointments. The patient expressed understanding and agreed to proceed.   HPI:  Acute telemedicine visit for Covid19: -Onset: yesterday, tested positive today -Symptoms include: cough, diarrhea, headache, pnd, nasal congestion -Denies:fevers, CP, SOB, inability to eat/drink/get out of bed -Has tried:tylenol -Pertinent past medical history: morbid obesity, HTN - see below -Pertinent medication allergies: No Known Allergies -COVID-19 vaccine status: 2 doses and a booster  ROS: See pertinent positives and negatives per HPI.  History reviewed. No pertinent past medical history.  Past Surgical History:  Procedure Laterality Date   ABDOMINAL HYSTERECTOMY     TUBAL LIGATION    Morbid obesity HTN   Current Outpatient Medications:    benzonatate (TESSALON PERLES) 100 MG capsule, Take 1 capsule (100 mg total) by mouth 3 (three) times daily as needed., Disp: 20 capsule, Rfl: 0   hydrochlorothiazide (HYDRODIURIL) 25 MG tablet, Take 1 tablet (25 mg total) by mouth daily., Disp: 90 tablet, Rfl: 1   nirmatrelvir/ritonavir EUA (PAXLOVID) 20 x 150 MG & 10 x 100MG  TABS, Take 3 tablets by mouth 2 (two) times daily for 5 days. (Take nirmatrelvir 150 mg two tablets twice daily for 5 days and ritonavir 100 mg one tablet twice daily for 5 days) Patient GFR is > 60, Disp: 30 tablet, Rfl: 0  EXAM:  VITALS per patient if applicable:  GENERAL: alert, oriented, appears well and in no acute distress  HEENT: atraumatic, conjunttiva clear, no obvious abnormalities on  inspection of external nose and ears  NECK: normal movements of the head and neck  LUNGS: on inspection no signs of respiratory distress, breathing rate appears normal, no obvious gross SOB, gasping or wheezing  CV: no obvious cyanosis  MS: moves all visible extremities without noticeable abnormality  PSYCH/NEURO: pleasant and cooperative, no obvious depression or anxiety, speech and thought processing grossly intact  ASSESSMENT AND PLAN:  Discussed the following assessment and plan:  COVID-19   Discussed treatment options (infusions and oral options and risk of drug interactions), ideal treatment window, potential complications, isolation and precautions for COVID-19.  Discussed possibility of rebound with antivirals and the need to reisolate if it should occur for 5 days. Checked for/reviewed any labs done in the last 90 days with GFR listed in HPI if available. After lengthy discussion, the patient opted for treatment with Paxlovid due to being higher risk for complications of covid or severe disease and other factors. Discussed EUA status of this drug and the fact that there is preliminary limited knowledge of risks/interactions/side effects per EUA document vs possible benefits and precautions. This information was shared with patient during the visit and also was provided in patient instructions. Also, advised that patient discuss risks/interactions and use with pharmacist/treatment team as well. The patient did want a prescription for cough, Tessalon Rx sent.  Other symptomatic care measures summarized in patient instructions. Work/School slipped offered: provided in patient instructions  Advised to seek prompt in person care if worsening, new symptoms arise, or if is not improving with treatment. Discussed options for inperson care if PCP office not available. Did let this patient know  that I only do telemedicine on Tuesdays and Thursdays for [place]. Advised to schedule follow up visit  with PCP or [facility] if any further questions or concerns to avoid delays in care.   I discussed the assessment and treatment plan with the patient. The patient was provided an opportunity to ask questions and all were answered. The patient agreed with the plan and demonstrated an understanding of the instructions.     Terressa Koyanagi, DO

## 2020-09-29 NOTE — Patient Instructions (Addendum)
---------------------------------------------------------------------------------------------------------------------------    WORK SLIP:  Patient Alexandria Morrison,  July 06, 1960, was seen for a medical visit today, 09/29/20 . Please excuse from work for a COVID like illness. We advise 10 days minimum from the onset of symptoms (09/28/20) PLUS 1 day of no fever and improved symptoms. Will defer to employer for a sooner return to work if symptoms have resolved, it is greater than 5 days since the positive test and the patient can wear a high-quality, tight fitting mask such as N95 or KN95 at all times for an additional 5 days. Would also suggest COVID19 antigen testing is negative prior to return.  Sincerely: E-signature: Dr. Colin Benton, DO  [facility] Ph: [phone]   ------------------------------------------------------------------------------------------------------------------------------   HOME CARE TIPS:   -I sent the medication(s) we discussed to your pharmacy: Meds ordered this encounter  Medications   nirmatrelvir/ritonavir EUA (PAXLOVID) 20 x 150 MG & 10 x 100MG TABS    Sig: Take 3 tablets by mouth 2 (two) times daily for 5 days. (Take nirmatrelvir 150 mg two tablets twice daily for 5 days and ritonavir 100 mg one tablet twice daily for 5 days) Patient GFR is > 60    Dispense:  30 tablet    Refill:  0   benzonatate (TESSALON PERLES) 100 MG capsule    Sig: Take 1 capsule (100 mg total) by mouth 3 (three) times daily as needed.    Dispense:  20 capsule    Refill:  0     -I sent in the [facility] treatment or referral you requested per our discussion. Please see the information provided below and discuss further with the pharmacist/treatment team.  -If taking Paxlovid, please review all medications, supplement and over the counter drugs with your pharmacist and ask them to check for any interactions. Please make the following changes to your regular  medications while taking Paxlovid:  -If taking Paxlovid, there is a chance of rebound illness after finishing your treatment. If you become sick again please isolate for an additional 5 days.    -can use tylenol if needed for fevers, aches and pains per instructions  -can use nasal saline a few times per day if you have nasal congestion; sometimes  a short course of Afrin nasal spray for 3 days can help with symptoms as well  -stay hydrated, drink plenty of fluids and eat small healthy meals - avoid dairy  -If the Covid test is positive, check out the [facility] website for more information on home care, transmission and treatment for COVID19  -follow up with your doctor in 2-3 days unless improving and feeling better  -stay home while sick, except to seek medical care. If you have COVID19, ideally it would be best to stay home for a full 10 days since the onset of symptoms PLUS one day of no fever and feeling better. Wear a good mask that fits snugly (such as N95 or KN95) if around others to reduce the risk of transmission.  It was nice to meet you today, and I really hope you are feeling better soon. I help [facility] out with telemedicine visits on Tuesdays and Thursdays and am available for visits on those days. If you have any concerns or questions following this visit please schedule a follow up visit with your Primary Care doctor or seek care at a local urgent care clinic to avoid delays in care.    Seek in person care or schedule a follow up video  visit promptly if your symptoms worsen, new concerns arise or you are not improving with treatment. Call 911 and/or seek emergency care if your symptoms are severe or life threatening.   FACT SHEET FOR PATIENTS, PARENTS, AND CAREGIVERS EMERGENCY USE AUTHORIZATION (EUA) OF PAXLOVID FOR CORONAVIRUS DISEASE 2019 (COVID-19) You are being given this Fact Sheet because your healthcare provider believes it is necessary to provide you with PAXLOVID for  the treatment of mild-to-moderate coronavirus disease (COVID-19) caused by the SARS-CoV-2 virus. This Fact Sheet contains information to help you understand the risks and benefits of taking the PAXLOVID you have received or may receive. The U.S. Food and Drug Administration (FDA) has issued an Emergency Use Authorization (EUA) to make PAXLOVID available during the COVID-19 pandemic (for more details about an EUA please see "What is an Emergency Use Authorization?" at the end of this document). PAXLOVID is not an FDA-approved medicine in the [place]. Read this Fact Sheet for information about PAXLOVID. Talk to your healthcare provider about your options or if you have any questions. It is your choice to take PAXLOVID.  What is COVID-19? COVID-19 is caused by a virus called a coronavirus. You can get COVID-19 through close contact with another person who has the virus. COVID-19 illnesses have ranged from very mild-to-severe, including illness resulting in death. While information so far suggests that most COVID-19 illness is mild, serious illness can happen and may cause some of your other medical conditions to become worse. Older people and people of all ages with severe, long lasting (chronic) medical conditions like heart disease, lung disease, and diabetes, for example seem to be at higher risk of being hospitalized for COVID-19.  What is PAXLOVID? PAXLOVID is an investigational medicine used to treat mild-to-moderate COVID-19 in adults and children [65 years of age and older weighing at least 24 pounds (41 kg)] with positive results of direct SARS-CoV-2 viral testing, and who are at high risk for progression to severe COVID-19, including hospitalization or death. PAXLOVID is investigational because it is still being studied. There is limited information about the safety and effectiveness of using PAXLOVID to treat people with mild-to-moderate COVID-19.  The FDA has authorized  the emergency use of PAXLOVID for the treatment of mild-tomoderate COVID-19 in adults and children [69 years of age and older weighing at least 88 pounds (87 kg)] with a positive test for the virus that causes COVID-19, and who are at high risk for progression to severe COVID-19, including hospitalization or death, under an EUA. 1 Revised: 10 April 2020   What should I tell my healthcare provider before I take PAXLOVID? Tell your healthcare provider if you: ? Have any allergies ? Have liver or kidney disease ? Are pregnant or plan to become pregnant ? Are breastfeeding a child ? Have any serious illnesses  Tell your healthcare provider about all the medicines you take, including prescription and over-the-counter medicines, vitamins, and herbal supplements. Some medicines may interact with PAXLOVID and may cause serious side effects. Keep a list of your medicines to show your healthcare provider and pharmacist when you get a new medicine.  You can ask your healthcare provider or pharmacist for a list of medicines that interact with PAXLOVID. Do not start taking a new medicine without telling your healthcare provider. Your healthcare provider can tell you if it is safe to take PAXLOVID with other medicines.  Tell your healthcare provider if you are taking combined hormonal contraceptive. PAXLOVID may affect how your birth control pills  work. Females who are able to become pregnant should use another effective alternative form of contraception or an additional barrier method of contraception. Talk to your healthcare provider if you have any questions about contraceptive methods that might be right for you.  How do I take PAXLOVID? ? PAXLOVID consists of 2 medicines: nirmatrelvir and ritonavir. o Take 2 pink tablets of nirmatrelvir with 1 white tablet of ritonavir by mouth 2 times each day (in the morning and in the evening) for 5 days. For each dose, take all 3 tablets at the same  time. o If you have kidney disease, talk to your healthcare provider. You may need a different dose. ? Swallow the tablets whole. Do not chew, break, or crush the tablets. ? Take PAXLOVID with or without food. ? Do not stop taking PAXLOVID without talking to your healthcare provider, even if you feel better. ? If you miss a dose of PAXLOVID within 8 hours of the time it is usually taken, take it as soon as you remember. If you miss a dose by more than 8 hours, skip the missed dose and take the next dose at your regular time. Do not take 2 doses of PAXLOVID at the same time. ? If you take too much PAXLOVID, call your healthcare provider or go to the nearest hospital emergency room right away. ? If you are taking a ritonavir- or cobicistat-containing medicine to treat hepatitis C or Human Immunodeficiency Virus (HIV), you should continue to take your medicine as prescribed by your healthcare provider. 2 Revised: 10 April 2020    Talk to your healthcare provider if you do not feel better or if you feel worse after 5 days.  Who should generally not take PAXLOVID? Do not take PAXLOVID if: ? You are allergic to nirmatrelvir, ritonavir, or any of the ingredients in PAXLOVID. ? You are taking any of the following medicines: o Alfuzosin o Pethidine, propoxyphene o Ranolazine o Amiodarone, dronedarone, flecainide, propafenone, quinidine o Colchicine o Lurasidone, pimozide, clozapine o Dihydroergotamine, ergotamine, methylergonovine o Lovastatin, simvastatin o Sildenafil (Revatio) for pulmonary arterial hypertension (PAH) o Triazolam, oral midazolam o Apalutamide o Carbamazepine, phenobarbital, phenytoin o Rifampin o St. John's Wort (hypericum perforatum) Taking PAXLOVID with these medicines may cause serious or life-threatening side effects or affect how PAXLOVID works.  These are not the only medicines that may cause serious side effects if taken with PAXLOVID. PAXLOVID may  increase or decrease the levels of multiple other medicines. It is very important to tell your healthcare provider about all of the medicines you are taking because additional laboratory tests or changes in the dose of your other medicines may be necessary while you are taking PAXLOVID. Your healthcare provider may also tell you about specific symptoms to watch out for that may indicate that you need to stop or decrease the dose of some of your other medicines.  What are the important possible side effects of PAXLOVID? Possible side effects of PAXLOVID are: ? Allergic Reactions. Allergic reactions can happen in people taking PAXLOVID, even after only 1 dose. Stop taking PAXLOVID and call your healthcare provider right away if you get any of the following symptoms of an allergic reaction: o hives o trouble swallowing or breathing o swelling of the mouth, lips, or face o throat tightness o hoarseness 3 Revised: 10 April 2020  o skin rash ? Liver Problems. Tell your healthcare provider right away if you have any of these signs and symptoms of liver problems: loss of  appetite, yellowing of your skin and the whites of eyes (jaundice), dark-colored urine, pale colored stools and itchy skin, stomach area (abdominal) pain. ? Resistance to HIV Medicines. If you have untreated HIV infection, PAXLOVID may lead to some HIV medicines not working as well in the future. ? Other possible side effects include: o altered sense of taste o diarrhea o high blood pressure o muscle aches These are not all the possible side effects of PAXLOVID. Not many people have taken PAXLOVID. Serious and unexpected side effects may happen. PAXLOVID is still being studied, so it is possible that all of the risks are not known at this time.  What other treatment choices are there? Veklury (remdesivir) is FDA-approved for the treatment of mild-to-moderate [id number] in certain adults and children. Talk with your doctor  to see if Marijean Heath is appropriate for you. Like PAXLOVID, FDA may also allow for the emergency use of other medicines to treat people with COVID-19. Go to https://price.info/ for information on the emergency use of other medicines that are authorized by FDA to treat people with COVID-19. Your healthcare provider may talk with you about clinical trials for which you may be eligible. It is your choice to be treated or not to be treated with PAXLOVID. Should you decide not to receive it or for your child not to receive it, it will not change your standard medical care.  What if I am pregnant or breastfeeding? There is no experience treating pregnant women or breastfeeding mothers with PAXLOVID. For a mother and unborn baby, the benefit of taking PAXLOVID may be greater than the risk from the treatment. If you are pregnant, discuss your options and specific situation with your healthcare provider. It is recommended that you use effective barrier contraception or do not have sexual activity while taking PAXLOVID. If you are breastfeeding, discuss your options and specific situation with your healthcare provider. 4 Revised: 10 April 2020   How do I report side effects with PAXLOVID? Contact your healthcare provider if you have any side effects that bother you or do not go away. Report side effects to FDA MedWatch at SmoothHits.hu or call 1-800-FDA1088 or you can report side effects to [employer]. at the contact information provided below. Website Fax number Telephone number www.pfizersafetyreporting.com [phone] How should I store [place]? Store PAXLOVID tablets at room temperature, between 68?F to 77?F (20?C to 25?C). How can I learn more about COVID-19? ? Ask your healthcare provider. ? Visit https://jacobson-johnson.com/. ? Contact your local or state  public health department. What is an Emergency Use Authorization (EUA)? The [place] FDA has made PAXLOVID available under an emergency access mechanism called an Emergency Use Authorization (EUA). The EUA is supported by a Education officer, museum and Human Service (HHS) declaration that circumstances exist to justify the emergency use of drugs and biological products during the COVID-19 pandemic. PAXLOVID for the treatment of mild-to-moderate COVID-19 in adults and children [38 years of age and older weighing at least 103 pounds (60 kg)] with positive results of direct SARS-CoV-2 viral testing, and who are at high risk for progression to severe COVID-19, including hospitalization or death, has not undergone the same type of review as an FDA-approved product. In issuing an EUA under the TGYBW-38 public health emergency, the FDA has determined, among other things, that based on the total amount of scientific evidence available including data from adequate and well-controlled clinical trials, if available, it is reasonable to believe that the product may be effective for  diagnosing, treating, or preventing COVID-19, or a serious or life-threatening disease or condition caused by COVID-19; that the known and potential benefits of the product, when used to diagnose, treat, or prevent such disease or condition, outweigh the known and potential risks of such product; and that there are no adequate, approved, and available alternatives. All of these criteria must be met to allow for the product to be used in the treatment of patients during the COVID-19 pandemic. The EUA for PAXLOVID is in effect for the duration of the COVID-19 declaration justifying emergency use of this product, unless terminated or revoked (after which the products may no longer be used under the EUA). 5 Revised: 10 April 2020     Additional Information For general questions, visit the website or call the telephone number  provided below. Website Telephone number www.COVID19oralRx.com [phone] (1-877-C19-PACK) You can also go to www.pfizermedinfo.com or call 2194389163 for more information. HOF-9749-1.5 Revised: 10 April 2020

## 2020-09-30 ENCOUNTER — Telehealth: Payer: Self-pay

## 2020-09-30 DIAGNOSIS — Z20822 Contact with and (suspected) exposure to covid-19: Secondary | ICD-10-CM | POA: Diagnosis not present

## 2020-09-30 NOTE — Telephone Encounter (Signed)
Called dropped during conversation,recalled received voicemail will try later.

## 2020-09-30 NOTE — Telephone Encounter (Signed)
Pt called stating that she has tested for covid and she had a virtual yesterday with Kriste Basque. Pt is stating that her job is requiring her to be tested with [place] and the results through [place]. Pt is wanting to be tested with [place]. Please Advise.

## 2020-09-30 NOTE — Telephone Encounter (Signed)
Pt called back about being covid tested.

## 2020-10-05 NOTE — Telephone Encounter (Signed)
Patient was tested at [facility] issue resolved

## 2020-11-06 ENCOUNTER — Other Ambulatory Visit: Payer: Self-pay | Admitting: Physician Assistant

## 2020-11-06 ENCOUNTER — Other Ambulatory Visit: Payer: Self-pay

## 2020-11-06 ENCOUNTER — Encounter: Payer: Self-pay | Admitting: Physician Assistant

## 2020-11-06 ENCOUNTER — Ambulatory Visit: Payer: BC Managed Care – PPO | Admitting: Physician Assistant

## 2020-11-06 VITALS — BP 132/84 | HR 80 | Temp 98.3°F | Ht 62.0 in | Wt 231.4 lb

## 2020-11-06 DIAGNOSIS — Z131 Encounter for screening for diabetes mellitus: Secondary | ICD-10-CM | POA: Diagnosis not present

## 2020-11-06 DIAGNOSIS — Z1231 Encounter for screening mammogram for malignant neoplasm of breast: Secondary | ICD-10-CM

## 2020-11-06 DIAGNOSIS — Z23 Encounter for immunization: Secondary | ICD-10-CM | POA: Diagnosis not present

## 2020-11-06 DIAGNOSIS — I10 Essential (primary) hypertension: Secondary | ICD-10-CM | POA: Diagnosis not present

## 2020-11-06 DIAGNOSIS — R600 Localized edema: Secondary | ICD-10-CM | POA: Diagnosis not present

## 2020-11-06 DIAGNOSIS — M25472 Effusion, left ankle: Secondary | ICD-10-CM

## 2020-11-06 DIAGNOSIS — Z1211 Encounter for screening for malignant neoplasm of colon: Secondary | ICD-10-CM

## 2020-11-06 DIAGNOSIS — Z13220 Encounter for screening for lipoid disorders: Secondary | ICD-10-CM

## 2020-11-06 DIAGNOSIS — M25471 Effusion, right ankle: Secondary | ICD-10-CM | POA: Diagnosis not present

## 2020-11-06 LAB — CBC WITH DIFFERENTIAL/PLATELET
Basophils Absolute: 0 10*3/uL (ref 0.0–0.1)
Basophils Relative: 0.8 % (ref 0.0–3.0)
Eosinophils Absolute: 0.6 10*3/uL (ref 0.0–0.7)
Eosinophils Relative: 10 % — ABNORMAL HIGH (ref 0.0–5.0)
HCT: 30.6 % — ABNORMAL LOW (ref 36.0–46.0)
Hemoglobin: 10.3 g/dL — ABNORMAL LOW (ref 12.0–15.0)
Lymphocytes Relative: 31.9 % (ref 12.0–46.0)
Lymphs Abs: 1.8 10*3/uL (ref 0.7–4.0)
MCHC: 33.6 g/dL (ref 30.0–36.0)
MCV: 86.7 fl (ref 78.0–100.0)
Monocytes Absolute: 0.6 10*3/uL (ref 0.1–1.0)
Monocytes Relative: 10.4 % (ref 3.0–12.0)
Neutro Abs: 2.7 10*3/uL (ref 1.4–7.7)
Neutrophils Relative %: 46.9 % (ref 43.0–77.0)
Platelets: 226 10*3/uL (ref 150.0–400.0)
RBC: 3.53 Mil/uL — ABNORMAL LOW (ref 3.87–5.11)
RDW: 15.6 % — ABNORMAL HIGH (ref 11.5–15.5)
WBC: 5.7 10*3/uL (ref 4.0–10.5)

## 2020-11-06 LAB — COMPREHENSIVE METABOLIC PANEL
ALT: 49 U/L — ABNORMAL HIGH (ref 0–35)
AST: 34 U/L (ref 0–37)
Albumin: 3.8 g/dL (ref 3.5–5.2)
Alkaline Phosphatase: 199 U/L — ABNORMAL HIGH (ref 39–117)
BUN: 23 mg/dL (ref 6–23)
CO2: 31 mEq/L (ref 19–32)
Calcium: 9.5 mg/dL (ref 8.4–10.5)
Chloride: 99 mEq/L (ref 96–112)
Creatinine, Ser: 1.15 mg/dL (ref 0.40–1.20)
GFR: 51.75 mL/min — ABNORMAL LOW (ref >60.00)
Glucose, Bld: 109 mg/dL — ABNORMAL HIGH (ref 70–99)
Potassium: 2.9 mEq/L — ABNORMAL LOW (ref 3.5–5.1)
Sodium: 138 mEq/L (ref 135–145)
Total Bilirubin: 0.4 mg/dL (ref 0.2–1.2)
Total Protein: 7.2 g/dL (ref 6.0–8.3)

## 2020-11-06 LAB — LIPID PANEL
Cholesterol: 251 mg/dL — ABNORMAL HIGH (ref 0–200)
HDL: 76.3 mg/dL (ref >39.00)
LDL Cholesterol: 154 mg/dL — ABNORMAL HIGH (ref 0–99)
NonHDL: 174.59
Total CHOL/HDL Ratio: 3
Triglycerides: 102 mg/dL (ref 0.0–149.0)
VLDL: 20.4 mg/dL (ref 0.0–40.0)

## 2020-11-06 LAB — HEMOGLOBIN A1C: Hgb A1c MFr Bld: 6.4 % (ref 4.6–6.5)

## 2020-11-06 MED ORDER — POTASSIUM CHLORIDE CRYS ER 20 MEQ PO TBCR: 20.0000 meq | EXTENDED_RELEASE_TABLET | Freq: Two times a day (BID) | ORAL | 0 refills | Status: DC

## 2020-11-06 MED ORDER — HYDROCHLOROTHIAZIDE 25 MG PO TABS: 25.0000 mg | ORAL_TABLET | Freq: Every day | ORAL | 1 refills | Status: DC

## 2020-11-06 NOTE — Patient Instructions (Addendum)
Good to meet you today! Please go to the lab for blood work and I will send results through MyChart.  Refilled your hydrochlorothiazide. Continue to take this daily.  Limit your salt! Drink at least 80 ounces of water daily as a goal. Walk daily; elevate legs above heart level at end of day. Try compression stockings.  You should be contacted to schedule an ECHOcardiogram, mammogram, and Cologuard.

## 2020-11-06 NOTE — Progress Notes (Signed)
Subjective:    Patient ID: Alexandria Morrison, female    DOB: 13-Aug-1960, 60 y.o.   MRN: [id number]  Chief Complaint  Patient presents with   Leg Swelling    HPI Patient is in today for TOC visit and recheck about leg edema.   Pt went to the ED on 08-28-20 for swelling in her legs for the previous 1- 1/2 months. She started to have weeping, which prompted her visit. Her BNP was normal. She was discharged on Lasix and potassium. She then followed up with Dr. Ardyth Harps on 09/01/20 and was started on HCTZ 25 for blood pressure control. Weight loss was also discussed.  Pt states that she works 12 hour days for [employer] the last 2 years, on a 2-3 schedule.   Continuously has swelling in her lower legs and ankles, but no weeping present anymore. No hx of CHF. No chest pain or pressure. Occasional SOB.  Able to lay flat at night.   Recently overcame COVID-19 as well on 09/30/20. Not fasting today.   No past medical history on file.  Past Surgical History:  Procedure Laterality Date   ABDOMINAL HYSTERECTOMY     TUBAL LIGATION      Family History  Problem Relation Age of Onset   Dementia Mother    Heart attack Father    Dementia Father    Hypertension Sister    Diabetes Brother    CVA Brother     Social History   Tobacco Use   Smoking status: Never   Smokeless tobacco: Never  Substance Use Topics   Alcohol use: No    Alcohol/week: 0.0 standard drinks   Drug use: No     No Known Allergies  Review of Systems REFER TO HPI FOR PERTINENT POSITIVES AND NEGATIVES      Objective:     BP 132/84   Pulse 80   Temp 98.3 F (36.8 C)   Ht 5\' 2"  (1.575 m)   Wt 231 lb 6.1 oz (105 kg)   SpO2 99%   BMI 42.32 kg/m   Wt Readings from Last 3 Encounters:  11/06/20 231 lb 6.1 oz (105 kg)  09/01/20 230 lb 3.2 oz (104.4 kg)  08/28/20 245 lb (111.1 kg)    BP Readings from Last 3 Encounters:  11/06/20 132/84  09/01/20 140/90  08/29/20 (!) 148/100     Physical Exam Vitals and  nursing note reviewed.  Constitutional:      Appearance: Normal appearance. She is obese. She is not toxic-appearing.  HENT:     Head: Normocephalic and atraumatic.     Right Ear: Tympanic membrane, ear canal and external ear normal.     Left Ear: Tympanic membrane, ear canal and external ear normal.     Nose: Nose normal.     Mouth/Throat:     Mouth: Mucous membranes are moist.  Eyes:     Extraocular Movements: Extraocular movements intact.     Conjunctiva/sclera: Conjunctivae normal.     Pupils: Pupils are equal, round, and reactive to light.  Cardiovascular:     Rate and Rhythm: Normal rate and regular rhythm.     Pulses: Normal pulses.     Heart sounds: Normal heart sounds.  Pulmonary:     Effort: Pulmonary effort is normal.     Breath sounds: Normal breath sounds.  Abdominal:     General: Abdomen is flat. Bowel sounds are normal.     Palpations: Abdomen is soft.  Musculoskeletal:  General: Normal range of motion.     Cervical back: Normal range of motion and neck supple.     Right lower leg: 3+ Edema present.     Left lower leg: 3+ Edema present.  Skin:    General: Skin is warm and dry.  Neurological:     General: No focal deficit present.     Mental Status: She is alert and oriented to person, place, and time.  Psychiatric:        Mood and Affect: Mood normal.        Behavior: Behavior normal.        Thought Content: Thought content normal.        Judgment: Judgment normal.       Assessment & Plan:   Problem List Items Addressed This Visit       Other   Morbid obesity (HCC)   Relevant Orders   ECHOCARDIOGRAM COMPLETE   Ankle edema, bilateral   Relevant Medications   hydrochlorothiazide (HYDRODIURIL) 25 MG tablet   Other Relevant Orders   ECHOCARDIOGRAM COMPLETE   Other Visit Diagnoses     Primary hypertension    -  Primary   Relevant Medications   hydrochlorothiazide (HYDRODIURIL) 25 MG tablet   Other Relevant Orders   CBC with  Differential/Platelet   Comprehensive metabolic panel   Lipid panel   ECHOCARDIOGRAM COMPLETE   Bilateral lower extremity edema       Relevant Orders   ECHOCARDIOGRAM COMPLETE   Need for immunization against influenza       Relevant Orders   Flu Vaccine QUAD 35mo+IM (Fluarix, Fluzone & Alfiuria Quad PF) (Completed)   Diabetes mellitus screening       Relevant Orders   Comprehensive metabolic panel   Hemoglobin A1c   Screening for cholesterol level       Relevant Orders   Lipid panel   Screening for colon cancer       Relevant Orders   Cologuard   Encounter for screening mammogram for malignant neoplasm of breast       Relevant Orders   MM 3D SCREEN BREAST BILATERAL        Meds ordered this encounter  Medications   hydrochlorothiazide (HYDRODIURIL) 25 MG tablet    Sig: Take 1 tablet (25 mg total) by mouth daily.    Dispense:  90 tablet    Refill:  1    I personally reviewed her previous notes and labs. Will check Echocardiogram. Continue HCTZ, refilled this today. Limit salt in diet and try compression stocking. Weight loss will help.  Also check non-fasting labs today and treat accordingly.   Referrals sent to get her caught up on mammogram and cologuard. Flu vaccine today.    This note was prepared with assistance of Conservation officer, historic buildings. Occasional wrong-word or sound-a-like substitutions may have occurred due to the inherent limitations of voice recognition software.  Time Spent: 34 minutes of total time was spent on the date of the encounter performing the following actions: chart review prior to seeing the patient, obtaining history, performing a medically necessary exam, counseling on the treatment plan, placing orders, and documenting in our EHR.    Iain Sawchuk M Darvell Monteforte, PA-C

## 2020-11-12 ENCOUNTER — Telehealth: Payer: Self-pay

## 2020-11-12 NOTE — Telephone Encounter (Signed)
Contacted the patient today because I was unable to verify her insurance for the Echo that was ordered-after speaking with [facility] I was told that the member will need to reach out to eligibility through [employer] and have them update her policy for [employer]- I can not verify insurance for this until [employer]'s system has been updated with the patients new information-I left the patient a detailed message making her aware of the steps she will need to take to ensure this is taken care of properly

## 2020-12-23 NOTE — Telephone Encounter (Signed)
Pt called back today stating that she checked with [employer] and she stated that they told her everything has been updated and everything is correct. Please Advise.

## 2020-12-31 ENCOUNTER — Ambulatory Visit: Payer: BC Managed Care – PPO

## 2021-01-01 ENCOUNTER — Encounter: Payer: BC Managed Care – PPO | Admitting: Physician Assistant

## 2021-01-29 ENCOUNTER — Ambulatory Visit (INDEPENDENT_AMBULATORY_CARE_PROVIDER_SITE_OTHER): Payer: BC Managed Care – PPO | Admitting: Physician Assistant

## 2021-01-29 ENCOUNTER — Other Ambulatory Visit: Payer: Self-pay

## 2021-01-29 ENCOUNTER — Encounter: Payer: Self-pay | Admitting: Physician Assistant

## 2021-01-29 VITALS — BP 125/81 | HR 70 | Temp 98.2°F | Ht 62.0 in | Wt 225.2 lb

## 2021-01-29 DIAGNOSIS — F339 Major depressive disorder, recurrent, unspecified: Secondary | ICD-10-CM

## 2021-01-29 DIAGNOSIS — Z780 Asymptomatic menopausal state: Secondary | ICD-10-CM | POA: Diagnosis not present

## 2021-01-29 DIAGNOSIS — E876 Hypokalemia: Secondary | ICD-10-CM

## 2021-01-29 DIAGNOSIS — R600 Localized edema: Secondary | ICD-10-CM

## 2021-01-29 DIAGNOSIS — Z1211 Encounter for screening for malignant neoplasm of colon: Secondary | ICD-10-CM | POA: Diagnosis not present

## 2021-01-29 DIAGNOSIS — E559 Vitamin D deficiency, unspecified: Secondary | ICD-10-CM | POA: Diagnosis not present

## 2021-01-29 DIAGNOSIS — I10 Essential (primary) hypertension: Secondary | ICD-10-CM

## 2021-01-29 DIAGNOSIS — D508 Other iron deficiency anemias: Secondary | ICD-10-CM | POA: Diagnosis not present

## 2021-01-29 DIAGNOSIS — M25472 Effusion, left ankle: Secondary | ICD-10-CM

## 2021-01-29 DIAGNOSIS — M25471 Effusion, right ankle: Secondary | ICD-10-CM

## 2021-01-29 DIAGNOSIS — Z0000 Encounter for general adult medical examination without abnormal findings: Secondary | ICD-10-CM | POA: Diagnosis not present

## 2021-01-29 DIAGNOSIS — E538 Deficiency of other specified B group vitamins: Secondary | ICD-10-CM | POA: Diagnosis not present

## 2021-01-29 DIAGNOSIS — R5383 Other fatigue: Secondary | ICD-10-CM | POA: Diagnosis not present

## 2021-01-29 DIAGNOSIS — R7303 Prediabetes: Secondary | ICD-10-CM

## 2021-01-29 DIAGNOSIS — E782 Mixed hyperlipidemia: Secondary | ICD-10-CM

## 2021-01-29 LAB — COMPREHENSIVE METABOLIC PANEL
ALT: 49 U/L — ABNORMAL HIGH (ref 0–35)
AST: 27 U/L (ref 0–37)
Albumin: 4.2 g/dL (ref 3.5–5.2)
Alkaline Phosphatase: 285 U/L — ABNORMAL HIGH (ref 39–117)
BUN: 29 mg/dL — ABNORMAL HIGH (ref 6–23)
CO2: 31 mEq/L (ref 19–32)
Calcium: 9.6 mg/dL (ref 8.4–10.5)
Chloride: 100 mEq/L (ref 96–112)
Creatinine, Ser: 1.27 mg/dL — ABNORMAL HIGH (ref 0.40–1.20)
GFR: 45.87 mL/min — ABNORMAL LOW (ref >60.00)
Glucose, Bld: 115 mg/dL — ABNORMAL HIGH (ref 70–99)
Potassium: 3.4 mEq/L — ABNORMAL LOW (ref 3.5–5.1)
Sodium: 140 mEq/L (ref 135–145)
Total Bilirubin: 0.5 mg/dL (ref 0.2–1.2)
Total Protein: 7.8 g/dL (ref 6.0–8.3)

## 2021-01-29 LAB — CBC WITH DIFFERENTIAL/PLATELET
Basophils Absolute: 0.1 10*3/uL (ref 0.0–0.1)
Basophils Relative: 1.3 % (ref 0.0–3.0)
Eosinophils Absolute: 0.7 10*3/uL (ref 0.0–0.7)
Eosinophils Relative: 13.5 % — ABNORMAL HIGH (ref 0.0–5.0)
HCT: 30.4 % — ABNORMAL LOW (ref 36.0–46.0)
Hemoglobin: 10.2 g/dL — ABNORMAL LOW (ref 12.0–15.0)
Lymphocytes Relative: 27.8 % (ref 12.0–46.0)
Lymphs Abs: 1.4 10*3/uL (ref 0.7–4.0)
MCHC: 33.4 g/dL (ref 30.0–36.0)
MCV: 88.2 fl (ref 78.0–100.0)
Monocytes Absolute: 0.6 10*3/uL (ref 0.1–1.0)
Monocytes Relative: 10.8 % (ref 3.0–12.0)
Neutro Abs: 2.4 10*3/uL (ref 1.4–7.7)
Neutrophils Relative %: 46.6 % (ref 43.0–77.0)
Platelets: 247 10*3/uL (ref 150.0–400.0)
RBC: 3.44 Mil/uL — ABNORMAL LOW (ref 3.87–5.11)
RDW: 14.7 % (ref 11.5–15.5)
WBC: 5.1 10*3/uL (ref 4.0–10.5)

## 2021-01-29 LAB — TSH: TSH: 1.49 u[IU]/mL (ref 0.35–5.50)

## 2021-01-29 LAB — VITAMIN D 25 HYDROXY (VIT D DEFICIENCY, FRACTURES): VITD: 12.64 ng/mL — ABNORMAL LOW (ref 30.00–100.00)

## 2021-01-29 LAB — VITAMIN B12: Vitamin B-12: 269 pg/mL (ref 211–911)

## 2021-01-29 MED ORDER — HYDROCHLOROTHIAZIDE 25 MG PO TABS: 25.0000 mg | ORAL_TABLET | Freq: Every day | ORAL | 1 refills | Status: DC

## 2021-01-29 MED ORDER — POTASSIUM CHLORIDE CRYS ER 20 MEQ PO TBCR: 20.0000 meq | EXTENDED_RELEASE_TABLET | Freq: Two times a day (BID) | ORAL | 2 refills | Status: DC

## 2021-01-29 NOTE — Progress Notes (Signed)
Subjective:    Patient ID: Alexandria Morrison, female    DOB: 1960-05-18, 61 y.o.   MRN: [id number]  Chief Complaint  Patient presents with   Annual Exam    HPI Patient is in today for annual exam. Works 3rd shift. Doing pretty well.  Acute concerns: Arthritis R leg / ankle, worse with going upstairs   Health maintenance: Lifestyle/ exercise: On her feet most of her 12 hours shift  Nutrition: Needs to be better per patient; sweets are her weakness; chocolates, sweet tea, Mt. Dew Mental health: Still experiencing some depression, doesn't want to talk about it, has never been treated for it.  Caffeine: Mt. Dew, choc, sweet teas Sleep: Sleeps after work and does pretty good Substance use: None Alcohol: None  Sexual activity: Not active currently  Colonoscopy: Never done, agrees to try Cologuard  Pap: Hx hysterectomy  Mammogram: Scheduled for 02/02/2021 DEXA: Scheduling today  Dental exams are UTD. Vision exam is coming up soon.    No past medical history on file.  Past Surgical History:  Procedure Laterality Date   ABDOMINAL HYSTERECTOMY     TUBAL LIGATION      Family History  Problem Relation Age of Onset   Dementia Mother    Heart attack Father    Dementia Father    Hypertension Sister    Diabetes Brother    CVA Brother     Social History   Tobacco Use   Smoking status: Never   Smokeless tobacco: Never  Substance Use Topics   Alcohol use: No    Alcohol/week: 0.0 standard drinks   Drug use: No     No Known Allergies  Review of Systems NEGATIVE UNLESS OTHERWISE INDICATED IN HPI      Objective:     BP 125/81    Pulse 70    Temp 98.2 F (36.8 C)    Ht 5\' 2"  (1.575 m)    Wt 225 lb 3.2 oz (102.2 kg)    SpO2 97%    BMI 41.19 kg/m   Wt Readings from Last 3 Encounters:  01/29/21 225 lb 3.2 oz (102.2 kg)  11/06/20 231 lb 6.1 oz (105 kg)  09/01/20 230 lb 3.2 oz (104.4 kg)    BP Readings from Last 3 Encounters:  01/29/21 125/81  11/06/20 132/84   09/01/20 140/90     Physical Exam Vitals and nursing note reviewed.  Constitutional:      Appearance: Normal appearance. She is obese. She is not toxic-appearing.  HENT:     Head: Normocephalic and atraumatic.     Right Ear: Tympanic membrane, ear canal and external ear normal.     Left Ear: Tympanic membrane, ear canal and external ear normal.     Nose: Nose normal.     Mouth/Throat:     Mouth: Mucous membranes are moist.  Eyes:     Extraocular Movements: Extraocular movements intact.     Conjunctiva/sclera: Conjunctivae normal.     Pupils: Pupils are equal, round, and reactive to light.  Cardiovascular:     Rate and Rhythm: Normal rate and regular rhythm.     Pulses: Normal pulses.     Heart sounds: Normal heart sounds.  Pulmonary:     Effort: Pulmonary effort is normal.     Breath sounds: Normal breath sounds.  Abdominal:     General: Abdomen is flat. Bowel sounds are normal.     Palpations: Abdomen is soft.  Musculoskeletal:  General: Normal range of motion.     Cervical back: Normal range of motion and neck supple.     Right lower leg: 2+ Edema present.     Left lower leg: 2+ Edema present.  Skin:    General: Skin is warm and dry.  Neurological:     General: No focal deficit present.     Mental Status: She is alert and oriented to person, place, and time.  Psychiatric:        Mood and Affect: Mood normal.        Behavior: Behavior normal.        Thought Content: Thought content normal.        Judgment: Judgment normal.       Assessment & Plan:   Problem List Items Addressed This Visit       Other   Morbid obesity ([place])   Relevant Orders   Iron, TIBC and Ferritin Panel   VITAMIN D 25 Hydroxy (Vit-D Deficiency, Fractures)   Vitamin B12   TSH   Comprehensive metabolic panel   CBC with Differential/Platelet   Vitamin D deficiency   Relevant Orders   VITAMIN D 25 Hydroxy (Vit-D Deficiency, Fractures)   Ankle edema, bilateral   Relevant  Medications   hydrochlorothiazide (HYDRODIURIL) 25 MG tablet   Other Visit Diagnoses     Encounter for annual physical exam    -  Primary   Relevant Orders   Iron, TIBC and Ferritin Panel   VITAMIN D 25 Hydroxy (Vit-D Deficiency, Fractures)   Vitamin B12   TSH   Comprehensive metabolic panel   CBC with Differential/Platelet   Screening for colon cancer       Relevant Orders   Cologuard   Postmenopausal       Relevant Orders   DG Bone Density   Primary hypertension       Relevant Medications   hydrochlorothiazide (HYDRODIURIL) 25 MG tablet   Bilateral lower extremity edema       Hypokalemia       Relevant Orders   Comprehensive metabolic panel   Prediabetes       Relevant Orders   Comprehensive metabolic panel   Other iron deficiency anemia       Relevant Orders   Iron, TIBC and Ferritin Panel   CBC with Differential/Platelet   Mixed hyperlipidemia       Relevant Medications   hydrochlorothiazide (HYDRODIURIL) 25 MG tablet   Other Relevant Orders   Comprehensive metabolic panel   Depression, recurrent (HCC)       Other fatigue       Relevant Orders   Iron, TIBC and Ferritin Panel   VITAMIN D 25 Hydroxy (Vit-D Deficiency, Fractures)   Vitamin B12   TSH   Comprehensive metabolic panel   CBC with Differential/Platelet   Vitamin B12 deficiency       Relevant Orders   Vitamin B12        Meds ordered this encounter  Medications   potassium chloride SA (KLOR-CON M) 20 MEQ tablet    Sig: Take 1 tablet (20 mEq total) by mouth 2 (two) times daily.    Dispense:  60 tablet    Refill:  2   hydrochlorothiazide (HYDRODIURIL) 25 MG tablet    Sig: Take 1 tablet (25 mg total) by mouth daily.    Dispense:  90 tablet    Refill:  1    Plan: -Age-appropriate screening and counseling performed today. Will check labs and call  with results. Preventive measures discussed and printed in AVS for patient.  -She is up-to-date with her dental.  She is scheduled to have her vision  updated. -She needs to get her Cologuard completed and sent back in.  I have also placed an order for a bone density scan for her to schedule. -She is going to work on weight loss.  Emphasized the need to change what she is eating and drinking, which will help the most. -Blood pressure is to goal and she will continue on hydrochlorothiazide 25 mg. -She has been taking the potassium supplement as well and we will track her potassium level today to make sure this is better. -Emphasized that she also needs to work on elevating her legs above heart level when she is at home resting and also needs to try compression socks and limiting her salt.  Echocardiogram was ordered on 11/06/2020 but I do not see that this was completed. -Untreated ongoing depression.  Card provided for [facility] in [place] for her to consider.  Plan to follow-up with me in 3 months.  Needs close follow-up to keep working on health goals.   This note was prepared with assistance of Systems analyst. Occasional wrong-word or sound-a-like substitutions may have occurred due to the inherent limitations of voice recognition software.   Rithwik Schmieg M Tyke Outman, PA-C

## 2021-01-29 NOTE — Patient Instructions (Signed)
Good to see you today! Please go to the lab for blood work and I will send results through MyChart. Please complete the Cologuard for colon cancer screening & also schedule for bone density test. Thank you for setting up your mammogram!  I would love for you to work on cutting back sodas / sweet tea, which is probably driving your sugar up.  Water with lemon / lime is preferred.   Elevate legs above heart level when resting at home. Try compression socks. Limit salt.  Consider counseling - card provided.

## 2021-01-30 LAB — IRON,TIBC AND FERRITIN PANEL
%SAT: 21 % (calc) (ref 16–45)
Ferritin: 85 ng/mL (ref 16–232)
Iron: 71 ug/dL (ref 45–160)
TIBC: 342 mcg/dL (calc) (ref 250–450)

## 2021-01-31 ENCOUNTER — Other Ambulatory Visit: Payer: Self-pay | Admitting: Physician Assistant

## 2021-01-31 DIAGNOSIS — R748 Abnormal levels of other serum enzymes: Secondary | ICD-10-CM

## 2021-01-31 MED ORDER — VITAMIN D (ERGOCALCIFEROL) 1.25 MG (50000 UNIT) PO CAPS: 50000.0000 [IU] | ORAL_CAPSULE | ORAL | 0 refills | Status: DC

## 2021-02-02 ENCOUNTER — Ambulatory Visit
Admission: RE | Admit: 2021-02-02 | Discharge: 2021-02-02 | Disposition: A | Discharge status: Home / Self Care | Payer: BC Managed Care – PPO | Source: Ambulatory Visit | Attending: Physician Assistant | Admitting: Physician Assistant

## 2021-02-02 ENCOUNTER — Other Ambulatory Visit: Payer: Self-pay

## 2021-02-02 DIAGNOSIS — Z1231 Encounter for screening mammogram for malignant neoplasm of breast: Secondary | ICD-10-CM

## 2021-02-26 ENCOUNTER — Other Ambulatory Visit: Payer: BC Managed Care – PPO

## 2021-03-02 ENCOUNTER — Ambulatory Visit
Admission: RE | Admit: 2021-03-02 | Discharge: 2021-03-02 | Disposition: A | Discharge status: Home / Self Care | Payer: BC Managed Care – PPO | Source: Ambulatory Visit | Attending: Physician Assistant | Admitting: Physician Assistant

## 2021-03-02 ENCOUNTER — Other Ambulatory Visit: Payer: Self-pay

## 2021-03-02 DIAGNOSIS — K760 Fatty (change of) liver, not elsewhere classified: Secondary | ICD-10-CM | POA: Diagnosis not present

## 2021-03-02 DIAGNOSIS — R748 Abnormal levels of other serum enzymes: Secondary | ICD-10-CM

## 2021-03-02 DIAGNOSIS — R945 Abnormal results of liver function studies: Secondary | ICD-10-CM | POA: Diagnosis not present

## 2021-03-03 ENCOUNTER — Other Ambulatory Visit: Payer: Self-pay

## 2021-03-03 DIAGNOSIS — R748 Abnormal levels of other serum enzymes: Secondary | ICD-10-CM

## 2021-03-22 ENCOUNTER — Telehealth: Payer: Self-pay | Admitting: Physician Assistant

## 2021-03-22 NOTE — Telephone Encounter (Signed)
Government social research officer at [facility] - Human resources officer Healthcare at [facility] Contact Type Call Who Is Calling Physician / Provider / Hospital Call Type Provider Call Message Only Reason for Call Request to send message to Office Initial Comment Caller states Vicky w/[facility], [phone], rec'd referral from Donnie Coffin; has contacted her 4x asking for more info; referral has not been looked at because info has not been rec'd; faxed to [phone] 3x (number on the coversheet rec'd); called on 02/21; faxed 22nd, 23rd, and 24th, Alyssa Allwardt is the referring provider; Additional Noelle Penner w/[facility], [phone]; fax [phone]; pt is Alexandria Morrison Sep 02, 1960; Wants this issue escalated; Called Gypsy Decant; gave info; states she will have Candise Bowens make sure info is received. Disp. Time Disposition Final User 03/22/2021 1:12:02 PM General Information Provided Yes Albin Fischer Call Closed By: Albin Fischer Transaction Date/Time: 03/22/2021 1:00:41 PM (ET)

## 2021-03-22 NOTE — Telephone Encounter (Signed)
Received a call from Gypsy Decant advising me that Vicky from [facility] has been attempting to reach [place]. I hand delivered the message to Misty Stanley who will call ASAP. Front office supervisor also advised the providers CMA.  Both the CMA and referral coordinator gone for the day. Will have them reach out to [facility] after they coordinate the efforts to assist with this request.

## 2021-05-07 ENCOUNTER — Ambulatory Visit: Payer: BC Managed Care – PPO | Admitting: Physician Assistant

## 2021-05-20 ENCOUNTER — Ambulatory Visit: Payer: BC Managed Care – PPO | Admitting: Physician Assistant

## 2021-05-20 ENCOUNTER — Encounter: Payer: Self-pay | Admitting: Physician Assistant

## 2021-05-20 VITALS — BP 123/86 | HR 75 | Temp 98.0°F | Ht 62.0 in | Wt 227.6 lb

## 2021-05-20 DIAGNOSIS — E559 Vitamin D deficiency, unspecified: Secondary | ICD-10-CM

## 2021-05-20 DIAGNOSIS — R7303 Prediabetes: Secondary | ICD-10-CM

## 2021-05-20 DIAGNOSIS — R748 Abnormal levels of other serum enzymes: Secondary | ICD-10-CM

## 2021-05-20 DIAGNOSIS — D508 Other iron deficiency anemias: Secondary | ICD-10-CM | POA: Diagnosis not present

## 2021-05-20 DIAGNOSIS — M25472 Effusion, left ankle: Secondary | ICD-10-CM

## 2021-05-20 DIAGNOSIS — M25471 Effusion, right ankle: Secondary | ICD-10-CM

## 2021-05-20 DIAGNOSIS — R600 Localized edema: Secondary | ICD-10-CM

## 2021-05-20 LAB — COMPREHENSIVE METABOLIC PANEL
ALT: 41 U/L — ABNORMAL HIGH (ref 0–35)
AST: 34 U/L (ref 0–37)
Albumin: 4.2 g/dL (ref 3.5–5.2)
Alkaline Phosphatase: 188 U/L — ABNORMAL HIGH (ref 39–117)
BUN: 30 mg/dL — ABNORMAL HIGH (ref 6–23)
CO2: 30 mEq/L (ref 19–32)
Calcium: 9.5 mg/dL (ref 8.4–10.5)
Chloride: 99 mEq/L (ref 96–112)
Creatinine, Ser: 1.46 mg/dL — ABNORMAL HIGH (ref 0.40–1.20)
GFR: 38.72 mL/min — ABNORMAL LOW (ref >60.00)
Glucose, Bld: 110 mg/dL — ABNORMAL HIGH (ref 70–99)
Potassium: 3.5 mEq/L (ref 3.5–5.1)
Sodium: 138 mEq/L (ref 135–145)
Total Bilirubin: 0.6 mg/dL (ref 0.2–1.2)
Total Protein: 7.7 g/dL (ref 6.0–8.3)

## 2021-05-20 LAB — CBC WITH DIFFERENTIAL/PLATELET
Basophils Absolute: 0.1 10*3/uL (ref 0.0–0.1)
Basophils Relative: 1.2 % (ref 0.0–3.0)
Eosinophils Absolute: 0.3 10*3/uL (ref 0.0–0.7)
Eosinophils Relative: 5.4 % — ABNORMAL HIGH (ref 0.0–5.0)
HCT: 33.4 % — ABNORMAL LOW (ref 36.0–46.0)
Hemoglobin: 11.3 g/dL — ABNORMAL LOW (ref 12.0–15.0)
Lymphocytes Relative: 24.1 % (ref 12.0–46.0)
Lymphs Abs: 1.4 10*3/uL (ref 0.7–4.0)
MCHC: 33.9 g/dL (ref 30.0–36.0)
MCV: 86.8 fl (ref 78.0–100.0)
Monocytes Absolute: 0.6 10*3/uL (ref 0.1–1.0)
Monocytes Relative: 10.6 % (ref 3.0–12.0)
Neutro Abs: 3.4 10*3/uL (ref 1.4–7.7)
Neutrophils Relative %: 58.7 % (ref 43.0–77.0)
Platelets: 215 10*3/uL (ref 150.0–400.0)
RBC: 3.85 Mil/uL — ABNORMAL LOW (ref 3.87–5.11)
RDW: 14.7 % (ref 11.5–15.5)
WBC: 5.9 10*3/uL (ref 4.0–10.5)

## 2021-05-20 LAB — HEMOGLOBIN A1C: Hgb A1c MFr Bld: 6.3 % (ref 4.6–6.5)

## 2021-05-20 LAB — VITAMIN D 25 HYDROXY (VIT D DEFICIENCY, FRACTURES): VITD: 23.83 ng/mL — ABNORMAL LOW (ref 30.00–100.00)

## 2021-05-20 NOTE — Patient Instructions (Addendum)
*  Talk with your pharmacist about Shingrix vaccines.  ? ?*Need records from [facility]  ? ?Labs (not fasting) today.  ? ?Placed order for ECHO again. ?

## 2021-05-20 NOTE — Progress Notes (Signed)
? ?Subjective:  ? ? Patient ID: Alexandria Morrison, female    DOB: 02/06/1960, 61 y.o.   MRN: [id number] ? ?Chief Complaint  ?Patient presents with  ? Labs  ?  Recheck; Non fasting.  ? Ankle Pain  ?  Complains of bilateral ankle pain. She states having arthritis in ankles. It has worsened in the last week with working. She says she does a lot of walking. She has taken OTC Advil, but says it does not help.   ? ? ?HPI ?Patient is in today for follow-up from 01/29/21.  ? ?Bilateral ankle & foot pain. Worse after walking all day. Swollen. Compression socks worsen pain. Advil not helping. ECHO ordered 11/06/20 but pt says she wasn't called about this.  ? ?Eating bananas each day. Says she ran out of potassium pills.  ?She has been doing B12 and Vit D supplements.  ? ?Has not completed Cologuard yet.  ? ?Needing labs rechecked, not fasting today.  ? ? ?History reviewed. No pertinent past medical history. ? ?Past Surgical History:  ?Procedure Laterality Date  ? ABDOMINAL HYSTERECTOMY    ? TUBAL LIGATION    ? ? ?Family History  ?Problem Relation Age of Onset  ? Dementia Mother   ? Heart attack Father   ? Dementia Father   ? Hypertension Sister   ? Diabetes Brother   ? CVA Brother   ? ? ?Social History  ? ?Tobacco Use  ? Smoking status: Never  ? Smokeless tobacco: Never  ?Substance Use Topics  ? Alcohol use: No  ?  Alcohol/week: 0.0 standard drinks  ? Drug use: No  ?  ? ?No Known Allergies ? ?Review of Systems ?NEGATIVE UNLESS OTHERWISE INDICATED IN HPI ? ? ?   ?Objective:  ?  ? ?BP 123/86   Pulse 75   Temp 98 ?F (36.7 ?C) (Temporal)   Ht 5\' 2"  (1.575 m)   Wt 227 lb 9.6 oz (103.2 kg)   SpO2 97%   BMI 41.63 kg/m?  ? ?Wt Readings from Last 3 Encounters:  ?05/20/21 227 lb 9.6 oz (103.2 kg)  ?01/29/21 225 lb 3.2 oz (102.2 kg)  ?11/06/20 231 lb 6.1 oz (105 kg)  ? ? ?BP Readings from Last 3 Encounters:  ?05/20/21 123/86  ?01/29/21 125/81  ?11/06/20 132/84  ?  ? ?Physical Exam ?Vitals and nursing note reviewed.  ?Constitutional:   ?    Appearance: Normal appearance. She is obese. She is not toxic-appearing.  ?HENT:  ?   Head: Normocephalic and atraumatic.  ?   Right Ear: External ear normal.  ?   Left Ear: External ear normal.  ?   Nose: Nose normal.  ?   Mouth/Throat:  ?   Mouth: Mucous membranes are moist.  ?Eyes:  ?   Extraocular Movements: Extraocular movements intact.  ?   Conjunctiva/sclera: Conjunctivae normal.  ?   Pupils: Pupils are equal, round, and reactive to light.  ?Cardiovascular:  ?   Rate and Rhythm: Normal rate and regular rhythm.  ?   Pulses: Normal pulses.  ?   Heart sounds: Normal heart sounds.  ?Pulmonary:  ?   Effort: Pulmonary effort is normal.  ?   Breath sounds: Normal breath sounds.  ?Musculoskeletal:     ?   General: Normal range of motion.  ?   Cervical back: Normal range of motion and neck supple.  ?   Right lower leg: 2+ Edema present.  ?   Left lower leg: 2+ Edema present.  ?  Skin: ?   General: Skin is warm and dry.  ?Neurological:  ?   General: No focal deficit present.  ?   Mental Status: She is alert and oriented to person, place, and time.  ?Psychiatric:     ?   Mood and Affect: Mood normal.     ?   Behavior: Behavior normal.     ?   Thought Content: Thought content normal.     ?   Judgment: Judgment normal.  ? ? ?   ?Assessment & Plan:  ? ?Problem List Items Addressed This Visit   ? ?  ? Other  ? Morbid obesity (HCC)  ? Vitamin D deficiency  ? Relevant Orders  ? Vitamin D (25 hydroxy) (Completed)  ? Ankle edema, bilateral  ? Relevant Orders  ? ECHOCARDIOGRAM COMPLETE  ? ?Other Visit Diagnoses   ? ? Other iron deficiency anemia    -  Primary  ? Relevant Orders  ? CBC with Differential/Platelet (Completed)  ? Prediabetes      ? Relevant Orders  ? Comprehensive metabolic panel (Completed)  ? Hemoglobin A1c (Completed)  ? Elevated liver enzymes      ? Bilateral lower extremity edema      ? Relevant Orders  ? ECHOCARDIOGRAM COMPLETE  ? ?  ? ? ?1. Other iron deficiency anemia ?2. Prediabetes ?Plan to recheck labs  today and treat accordingly. ? ?3. Elevated liver enzymes ?I do not see any records from [facility] hepatology number patient was referred.  Patient is telling me that she has seen them though.  Plan to try to get records and will also repeat labs today. ? ?4. Vitamin D deficiency ?Need to recheck vitamin D and treat accordingly. ? ?5. Morbid obesity (HCC) ?6. Bilateral lower extremity edema ?7. Ankle edema, bilateral ?Lower extremity edema most likely secondary to obesity, standing all day, dependent edema.  However I did place order for echocardiogram and this was not followed through on.  I plan to place this order again today just to make sure that she is not having any cardiac etiology resulting in with swelling in her legs.  We might need to try her on Lasix to help get some of the fluid off as she is not tolerating compression socks.  Again, needed check with her electrolyte stand and treat from there. ? ?Plan for close follow-up with patient in about 3 months or as needed.  She really needs to work on lifestyle changes. ? ?This note was prepared with assistance of Dragon voice recognition software. Occasional wrong-word or sound-a-like substitutions may have occurred due to the inherent limitations of voice recognition software. ? ? ? ?Calayah Guadarrama M Toddy Boyd, PA-C ?

## 2021-06-03 ENCOUNTER — Encounter: Payer: Self-pay | Admitting: Physician Assistant

## 2021-06-03 ENCOUNTER — Ambulatory Visit: Payer: BC Managed Care – PPO | Admitting: Physician Assistant

## 2021-06-03 VITALS — BP 125/89 | HR 60 | Temp 97.9°F | Ht 62.0 in | Wt 229.0 lb

## 2021-06-03 DIAGNOSIS — D508 Other iron deficiency anemias: Secondary | ICD-10-CM

## 2021-06-03 DIAGNOSIS — R600 Localized edema: Secondary | ICD-10-CM

## 2021-06-03 DIAGNOSIS — E559 Vitamin D deficiency, unspecified: Secondary | ICD-10-CM

## 2021-06-03 DIAGNOSIS — R7303 Prediabetes: Secondary | ICD-10-CM | POA: Diagnosis not present

## 2021-06-03 DIAGNOSIS — R748 Abnormal levels of other serum enzymes: Secondary | ICD-10-CM

## 2021-06-03 DIAGNOSIS — N1832 Chronic kidney disease, stage 3b: Secondary | ICD-10-CM

## 2021-06-03 MED ORDER — FERROUS SULFATE 325 (65 FE) MG PO TABS: 325.0000 mg | ORAL_TABLET | ORAL | 3 refills | Status: AC

## 2021-06-03 NOTE — Progress Notes (Signed)
? ?Subjective:  ? ? Patient ID: Alexandria Morrison, female    DOB: 1960/07/01, 61 y.o.   MRN: [id number] ? ?Chief Complaint  ?Patient presents with  ? Results  ?  Discuss lab results.   ? ? ?HPI ?Patient is in today for in-person discussion about lab results from 05/20/21.  ? ?My note from her labs: ?"Her vitamin D is just slightly low and she can supplement with 1000 to 2000 IU over-the-counter daily. ?  ?I am concerned about her liver function tests that remain elevated, but stable, but it appears that her kidney functioning is worsening and I am going to need to switch her off the hydrochlorothiazide.  She needs to discontinue this medication right now. ?  ?I know she is really concerned about the swelling in her lower legs, but I would be nervous about starting Lasix with her at this point given the results of her renal function.  Really need to get the echocardiogram scheduled as well. ?  ?Still showing signs of iron deficient anemia.  Thankfully this is stable.  I would like to check a fecal occult blood if she cannot get the Cologuard completed. ?  ?Hemoglobin A1c is at 6.3, which is very close to diabetic range." ? ? ?Denies any chest pain. Only SOB when going upstairs, but that's been ongoing. Sleeps flat, no orthopnea. Ongoing leg swelling - says she has a hard time keeping elevated.  ? ? ?History reviewed. No pertinent past medical history. ? ?Past Surgical History:  ?Procedure Laterality Date  ? ABDOMINAL HYSTERECTOMY    ? TUBAL LIGATION    ? ? ?Family History  ?Problem Relation Age of Onset  ? Dementia Mother   ? Heart attack Father   ? Dementia Father   ? Hypertension Sister   ? Diabetes Brother   ? CVA Brother   ? ? ?Social History  ? ?Tobacco Use  ? Smoking status: Never  ? Smokeless tobacco: Never  ?Substance Use Topics  ? Alcohol use: No  ?  Alcohol/week: 0.0 standard drinks  ? Drug use: No  ?  ? ?No Known Allergies ? ?Review of Systems ?NEGATIVE UNLESS OTHERWISE INDICATED IN HPI ? ? ?   ?Objective:  ?   ? ?BP 125/89 (BP Location: Left Arm, Patient Position: Sitting, Cuff Size: Large)   Pulse 60   Temp 97.9 ?F (36.6 ?C) (Temporal)   Ht 5\' 2"  (1.575 m)   Wt 229 lb (103.9 kg)   SpO2 97%   BMI 41.88 kg/m?  ? ?Wt Readings from Last 3 Encounters:  ?06/03/21 229 lb (103.9 kg)  ?05/20/21 227 lb 9.6 oz (103.2 kg)  ?01/29/21 225 lb 3.2 oz (102.2 kg)  ? ? ?BP Readings from Last 3 Encounters:  ?06/03/21 125/89  ?05/20/21 123/86  ?01/29/21 125/81  ?  ? ?Physical Exam ?Vitals and nursing note reviewed.  ?Constitutional:   ?   Appearance: Normal appearance. She is obese. She is not toxic-appearing.  ?HENT:  ?   Head: Normocephalic and atraumatic.  ?   Right Ear: External ear normal.  ?   Left Ear: External ear normal.  ?   Nose: Nose normal.  ?   Mouth/Throat:  ?   Mouth: Mucous membranes are moist.  ?Eyes:  ?   Extraocular Movements: Extraocular movements intact.  ?   Conjunctiva/sclera: Conjunctivae normal.  ?   Pupils: Pupils are equal, round, and reactive to light.  ?Cardiovascular:  ?   Rate and Rhythm: Normal rate  and regular rhythm.  ?   Pulses: Normal pulses.  ?   Heart sounds: Normal heart sounds.  ?Pulmonary:  ?   Effort: Pulmonary effort is normal.  ?   Breath sounds: Normal breath sounds.  ?Musculoskeletal:     ?   General: Normal range of motion.  ?   Cervical back: Normal range of motion and neck supple.  ?   Right lower leg: 2+ Edema present.  ?   Left lower leg: 2+ Edema present.  ?Skin: ?   General: Skin is warm and dry.  ?Neurological:  ?   General: No focal deficit present.  ?   Mental Status: She is alert and oriented to person, place, and time.  ?Psychiatric:     ?   Mood and Affect: Mood normal.     ?   Behavior: Behavior normal.     ?   Thought Content: Thought content normal.     ?   Judgment: Judgment normal.  ? ? ?   ?Assessment & Plan:  ? ?Problem List Items Addressed This Visit   ? ?  ? Other  ? Morbid obesity (HCC)  ? Vitamin D deficiency  ? ?Other Visit Diagnoses   ? ? Other iron deficiency  anemia    -  Primary  ? Relevant Medications  ? vitamin B-12 (CYANOCOBALAMIN) 1000 MCG tablet  ? ferrous sulfate 325 (65 FE) MG tablet  ? Prediabetes      ? Elevated liver enzymes      ? Bilateral lower extremity edema      ? CKD stage G3b/A1, GFR 30-44 and albumin creatinine ratio <30 mg/g (HCC)      ? ?  ? ? ? ?Meds ordered this encounter  ?Medications  ? ferrous sulfate 325 (65 FE) MG tablet  ?  Sig: Take 1 tablet (325 mg total) by mouth every other day. Take with Vit C.  ?  Dispense:  30 tablet  ?  Refill:  3  ?  Order Specific Question:   Supervising Provider  ?  Answer:   Shelva Majestic [[id number]]  ? ?PLAN: ? ?Thank you for coming in today.  It was good to see you as always. ? ?For the vitamin D level will continue to take 1000 to 2000 IU daily. ? ?Blood pressure is looking great.  Lets stay off the hydrochlorothiazide.  Try to check it at the pharmacy if you are able. ? ?Can start back on ferrous sulfate 325 mg every other day with vitamin C.  It may cause your stools to be black.  May cause constipation.  Let me know if you have any trouble with this. ? ?You need to complete the Cologuard and get this returned in the mail. ? ?I am really concerned about your liver function tests. ?Call [phone] ask for Vicky Lampkin to schedule with liver specialist. ? ?Please try to limit Advil.  Increase water intake at least 64 to 80 ounces per day. ?Slowly cut back on [facility].  Find an alternative sweetener for the sweet tea. ? ? ?See you back as scheduled in July!!  Call sooner if any concerns. ? ?This note was prepared with assistance of Dragon voice recognition software. Occasional wrong-word or sound-a-like substitutions may have occurred due to the inherent limitations of voice recognition software. ? ?Time Spent: ?35 minutes of total time was spent on the date of the encounter performing the following actions: chart review prior to seeing the patient, obtaining  history, performing a medically necessary  exam, counseling on the treatment plan, placing orders, and documenting in our EHR.   ? ?Kendel Pesnell M Timm Bonenberger, PA-C ?

## 2021-06-03 NOTE — Patient Instructions (Signed)
Thank you for coming in today.  It was good to see you as always. ? ?For the vitamin D level will continue to take 1000 to 2000 IU daily. ? ?Blood pressure is looking great.  Lets stay off the hydrochlorothiazide.  Try to check it at the pharmacy if you are able. ? ?Can start back on ferrous sulfate 325 mg every other day with vitamin C.  It may cause your stools to be black.  May cause constipation.  Let me know if you have any trouble with this. ? ?You need to complete the Cologuard and get this returned in the mail. ? ?I am really concerned about your liver function tests. ?Call [phone] ask for Vicky Lampkin to schedule with liver specialist. ? ?Please try to limit Advil.  Increase water intake at least 64 to 80 ounces per day. ?Slowly cut back on [facility].  Find an alternative sweetener for the sweet tea. ? ? ?See you back as scheduled in July!!  Call sooner if any concerns. ? ?

## 2021-07-14 ENCOUNTER — Telehealth: Payer: Self-pay | Admitting: Physician Assistant

## 2021-07-14 NOTE — Telephone Encounter (Signed)
Lequita Halt with [facility] ph# [phone] called to request the Order for Patient's Bone Density Scan that is in Epic be signed. Patient's Bone Density Appointment is scheduled for tomorrow 07/15/21 at 9 am.

## 2021-07-15 ENCOUNTER — Ambulatory Visit
Admission: RE | Admit: 2021-07-15 | Discharge: 2021-07-15 | Disposition: A | Discharge status: Home / Self Care | Payer: BC Managed Care – PPO | Source: Ambulatory Visit | Attending: Physician Assistant | Admitting: Physician Assistant

## 2021-07-15 ENCOUNTER — Other Ambulatory Visit: Payer: Self-pay

## 2021-07-15 DIAGNOSIS — M8589 Other specified disorders of bone density and structure, multiple sites: Secondary | ICD-10-CM | POA: Diagnosis not present

## 2021-07-15 DIAGNOSIS — Z780 Asymptomatic menopausal state: Secondary | ICD-10-CM | POA: Diagnosis not present

## 2021-07-15 NOTE — Telephone Encounter (Signed)
Orders are in Epic and signed for pt Bone density scan; attempted to contact [place] but unable to reach

## 2021-07-29 ENCOUNTER — Ambulatory Visit: Payer: BC Managed Care – PPO | Admitting: Physician Assistant

## 2021-07-29 ENCOUNTER — Encounter: Payer: Self-pay | Admitting: Physician Assistant

## 2021-07-29 VITALS — BP 160/98 | HR 97 | Temp 98.2°F | Ht 62.0 in | Wt 233.6 lb

## 2021-07-29 DIAGNOSIS — I10 Essential (primary) hypertension: Secondary | ICD-10-CM | POA: Diagnosis not present

## 2021-07-29 DIAGNOSIS — R600 Localized edema: Secondary | ICD-10-CM

## 2021-07-29 DIAGNOSIS — R011 Cardiac murmur, unspecified: Secondary | ICD-10-CM | POA: Diagnosis not present

## 2021-07-29 DIAGNOSIS — N1832 Chronic kidney disease, stage 3b: Secondary | ICD-10-CM

## 2021-07-29 DIAGNOSIS — D508 Other iron deficiency anemias: Secondary | ICD-10-CM | POA: Diagnosis not present

## 2021-07-29 DIAGNOSIS — R748 Abnormal levels of other serum enzymes: Secondary | ICD-10-CM | POA: Diagnosis not present

## 2021-07-29 DIAGNOSIS — R935 Abnormal findings on diagnostic imaging of other abdominal regions, including retroperitoneum: Secondary | ICD-10-CM

## 2021-07-29 LAB — CBC WITH DIFFERENTIAL/PLATELET
Basophils Absolute: 0.1 10*3/uL (ref 0.0–0.1)
Basophils Relative: 1.2 % (ref 0.0–3.0)
Eosinophils Absolute: 0.4 10*3/uL (ref 0.0–0.7)
Eosinophils Relative: 7.2 % — ABNORMAL HIGH (ref 0.0–5.0)
HCT: 32.8 % — ABNORMAL LOW (ref 36.0–46.0)
Hemoglobin: 11.1 g/dL — ABNORMAL LOW (ref 12.0–15.0)
Lymphocytes Relative: 31.1 % (ref 12.0–46.0)
Lymphs Abs: 1.6 10*3/uL (ref 0.7–4.0)
MCHC: 33.7 g/dL (ref 30.0–36.0)
MCV: 87.1 fl (ref 78.0–100.0)
Monocytes Absolute: 0.5 10*3/uL (ref 0.1–1.0)
Monocytes Relative: 9.8 % (ref 3.0–12.0)
Neutro Abs: 2.6 10*3/uL (ref 1.4–7.7)
Neutrophils Relative %: 50.7 % (ref 43.0–77.0)
Platelets: 218 10*3/uL (ref 150.0–400.0)
RBC: 3.77 Mil/uL — ABNORMAL LOW (ref 3.87–5.11)
RDW: 15 % (ref 11.5–15.5)
WBC: 5.2 10*3/uL (ref 4.0–10.5)

## 2021-07-29 LAB — COMPREHENSIVE METABOLIC PANEL
ALT: 29 U/L (ref 0–35)
AST: 26 U/L (ref 0–37)
Albumin: 4.2 g/dL (ref 3.5–5.2)
Alkaline Phosphatase: 143 U/L — ABNORMAL HIGH (ref 39–117)
BUN: 19 mg/dL (ref 6–23)
CO2: 28 mEq/L (ref 19–32)
Calcium: 9.5 mg/dL (ref 8.4–10.5)
Chloride: 104 mEq/L (ref 96–112)
Creatinine, Ser: 1.16 mg/dL (ref 0.40–1.20)
GFR: 50.96 mL/min — ABNORMAL LOW (ref >60.00)
Glucose, Bld: 102 mg/dL — ABNORMAL HIGH (ref 70–99)
Potassium: 3.9 mEq/L (ref 3.5–5.1)
Sodium: 140 mEq/L (ref 135–145)
Total Bilirubin: 0.5 mg/dL (ref 0.2–1.2)
Total Protein: 7.3 g/dL (ref 6.0–8.3)

## 2021-07-29 LAB — BRAIN NATRIURETIC PEPTIDE: Pro B Natriuretic peptide (BNP): 16 pg/mL (ref 0.0–100.0)

## 2021-07-29 MED ORDER — LOSARTAN POTASSIUM 25 MG PO TABS: 25.0000 mg | ORAL_TABLET | Freq: Every day | ORAL | 0 refills | Status: DC

## 2021-07-29 MED ORDER — FUROSEMIDE 20 MG PO TABS: 20.0000 mg | ORAL_TABLET | Freq: Every day | ORAL | 3 refills | Status: DC

## 2021-07-29 MED ORDER — GABAPENTIN 300 MG PO CAPS: 300.0000 mg | ORAL_CAPSULE | Freq: Every day | ORAL | 0 refills | Status: DC

## 2021-07-29 MED ORDER — POTASSIUM CHLORIDE CRYS ER 20 MEQ PO TBCR: 20.0000 meq | EXTENDED_RELEASE_TABLET | Freq: Once | ORAL | 0 refills | Status: DC

## 2021-07-29 NOTE — Patient Instructions (Addendum)
Labs today, fecal stool test for blood  Really need to get in with [facility], also need to have ECHO of heart done (both previously ordered)  CT abd/ pelvis ordered   Losartan 25 mg daily to help with blood pressure control Limit salt in diet Aim for 60 oz water daily  Low dose Lasix 20 mg daily to help with fluid on legs Take with one potassium pill  Gabapentin 300 mg at end of shift to help with nerve pain Please try to avoid OTC Tylenol and NSAIDs like Aleve

## 2021-07-29 NOTE — Progress Notes (Signed)
Subjective:    Patient ID: Alexandria Morrison, female    DOB: 23-Jul-1960, 61 y.o.   MRN: [id number]  Chief Complaint  Patient presents with   Follow-up    Pt being seen for 3 mon f/u; pt c/o both ankles in pain, unable to walk at times, pt job requires to be on feet for 12 hours and constant walking; compression socks not helping; also having R shoulder pain;     HPI Patient is in today for regular f/up. See A/P for details discussed.    History reviewed. No pertinent past medical history.  Past Surgical History:  Procedure Laterality Date   ABDOMINAL HYSTERECTOMY     TUBAL LIGATION      Family History  Problem Relation Age of Onset   Dementia Mother    Heart attack Father    Dementia Father    Hypertension Sister    Diabetes Brother    CVA Brother     Social History   Tobacco Use   Smoking status: Never   Smokeless tobacco: Never  Substance Use Topics   Alcohol use: No    Alcohol/week: 0.0 standard drinks of alcohol   Drug use: No     No Known Allergies  Review of Systems NEGATIVE UNLESS OTHERWISE INDICATED IN HPI      Objective:     BP (!) 160/98 (BP Location: Right Arm) Comment: Manual  Pulse 97   Temp 98.2 F (36.8 C) (Temporal)   Ht 5\' 2"  (1.575 m)   Wt 233 lb 9.6 oz (106 kg)   SpO2 99%   BMI 42.73 kg/m   Wt Readings from Last 3 Encounters:  07/29/21 233 lb 9.6 oz (106 kg)  06/03/21 229 lb (103.9 kg)  05/20/21 227 lb 9.6 oz (103.2 kg)    BP Readings from Last 3 Encounters:  07/29/21 (!) 160/98  06/03/21 125/89  05/20/21 123/86     Physical Exam Vitals and nursing note reviewed.  Constitutional:      Appearance: Normal appearance. She is obese. She is not toxic-appearing.  HENT:     Head: Normocephalic and atraumatic.     Right Ear: External ear normal.     Left Ear: External ear normal.     Nose: Nose normal.     Mouth/Throat:     Mouth: Mucous membranes are moist.  Eyes:     Extraocular Movements: Extraocular movements intact.      Conjunctiva/sclera: Conjunctivae normal.     Pupils: Pupils are equal, round, and reactive to light.  Cardiovascular:     Rate and Rhythm: Normal rate and regular rhythm.     Pulses: Normal pulses.     Heart sounds: Murmur heard.     Systolic murmur is present with a grade of 2/6.  Pulmonary:     Effort: Pulmonary effort is normal.     Breath sounds: Normal breath sounds.  Abdominal:     General: Abdomen is flat. Bowel sounds are normal.     Palpations: Abdomen is soft.  Musculoskeletal:        General: Normal range of motion.     Cervical back: Normal range of motion and neck supple.     Right lower leg: 2+ Edema present.     Left lower leg: 2+ Edema present.  Skin:    General: Skin is warm and dry.  Neurological:     General: No focal deficit present.     Mental Status: She is alert and oriented  to person, place, and time.  Psychiatric:        Mood and Affect: Mood normal.        Behavior: Behavior normal.        Thought Content: Thought content normal.        Judgment: Judgment normal.        Assessment & Plan:   Problem List Items Addressed This Visit       Other   Morbid obesity (HCC)   Other Visit Diagnoses     Primary hypertension    -  Primary   Relevant Medications   losartan (COZAAR) 25 MG tablet   furosemide (LASIX) 20 MG tablet   Other Relevant Orders   CBC with Differential/Platelet (Completed)   Comprehensive metabolic panel (Completed)   B Nat Peptide (Completed)   Other iron deficiency anemia       Relevant Orders   CBC with Differential/Platelet (Completed)   Fecal occult blood, imunochemical   Elevated liver enzymes       Relevant Orders   Comprehensive metabolic panel (Completed)   CT ABDOMEN PELVIS WO CONTRAST   Bilateral lower extremity edema       Relevant Orders   CBC with Differential/Platelet (Completed)   Comprehensive metabolic panel (Completed)   CT ABDOMEN PELVIS WO CONTRAST   CKD stage G3b/A1, GFR 30-44 and albumin  creatinine ratio <30 mg/g (HCC)       Relevant Orders   Comprehensive metabolic panel (Completed)   CT ABDOMEN PELVIS WO CONTRAST   Abnormal [place] (ultrasound) of abdomen       Relevant Orders   CT ABDOMEN PELVIS WO CONTRAST   Cardiac murmur       Relevant Orders   B Nat Peptide (Completed)        Meds ordered this encounter  Medications   gabapentin (NEURONTIN) 300 MG capsule    Sig: Take 1 capsule (300 mg total) by mouth at bedtime.    Dispense:  30 capsule    Refill:  0    Order Specific Question:   Supervising Provider    Answer:   Shelva Majestic [[id number]]   losartan (COZAAR) 25 MG tablet    Sig: Take 1 tablet (25 mg total) by mouth daily.    Dispense:  30 tablet    Refill:  0    Order Specific Question:   Supervising Provider    Answer:   Shelva Majestic [[id number]]   furosemide (LASIX) 20 MG tablet    Sig: Take 1 tablet (20 mg total) by mouth daily. Take to help reduce swelling in legs.    Dispense:  30 tablet    Refill:  3    Order Specific Question:   Supervising Provider    Answer:   Shelva Majestic [[id number]]   potassium chloride SA (KLOR-CON M) 20 MEQ tablet    Sig: Take 1 tablet (20 mEq total) by mouth once for 1 dose.    Dispense:  30 tablet    Refill:  0    Order Specific Question:   Supervising Provider    Answer:   Durene Cal, STEPHEN O [[id number]]   1. Primary hypertension Losartan 25 mg daily to help with blood pressure control Limit salt in diet Aim for 60 oz water daily  2. Other iron deficiency anemia Repeat labs today FOB test advised  3. Elevated liver enzymes Have again stressed importance of f/up with liver specialists, info provided for her to contact them for appt  4. Bilateral lower extremity edema Patient's ongoing biggest concern Ankle swelling / edema is terrible at end of her 12 hour shift. Compression socks not helping. Can barely walk by end of day.  Has to buy extra wide shoes. Needle sensation in bottom of feet.  Taking Advil 3 tablets  twice daily.  Drinks about 40 oz water daily.   Low dose Lasix 20 mg daily to help with fluid on legs Take with one potassium pill Gabapentin 300 mg at end of shift to help with nerve pain Please try to avoid OTC Tylenol and NSAIDs like Aleve   5. CKD stage G3b/A1, GFR 30-44 and albumin creatinine ratio <30 mg/g (HCC) Repeat labs  6. Morbid obesity (HCC) Advised working on weight loss  7. Abnormal [place] (ultrasound) of abdomen 03/03/21 [place] abd RUQ impression  1. Noted within the gallbladder is a stable appearing approximately 8 mm echogenic focus consistent with tiny gallstone or polyp. No interim change from prior ultrasound 08/16/2017. No evidence of cholecystitis or biliary distention.   2. Heterogeneous hepatic parenchymal pattern consistent fatty infiltration and or hepatocellular disease. No focal hepatic abnormality identified.   Plan for CT abd/pelvis (for LFTs, closer look at liver, and ongoing decrease in renal function)  8. Cardiac murmur Again advised ECHO strongly recommended - could account for her worsening BLE edema and need to look into murmur closer   Patient has a very difficult work schedule and has had difficulty with compliance. Strongly advised her to follow recommendations for her medical care.    Return in about 4 weeks (around 08/26/2021) for recheck .   Leanah Kolander M Breniyah Romm, PA-C

## 2021-08-04 ENCOUNTER — Telehealth: Payer: Self-pay | Admitting: Physician Assistant

## 2021-08-04 NOTE — Telephone Encounter (Signed)
Changed mind about leaving message

## 2021-08-26 ENCOUNTER — Other Ambulatory Visit: Payer: Self-pay | Admitting: Physician Assistant

## 2021-08-26 ENCOUNTER — Encounter: Payer: Self-pay | Admitting: Physician Assistant

## 2021-08-26 ENCOUNTER — Ambulatory Visit: Payer: BC Managed Care – PPO | Admitting: Physician Assistant

## 2021-08-26 VITALS — BP 110/70 | HR 88 | Temp 97.6°F | Ht 62.0 in | Wt 241.2 lb

## 2021-08-26 DIAGNOSIS — D508 Other iron deficiency anemias: Secondary | ICD-10-CM

## 2021-08-26 DIAGNOSIS — R0600 Dyspnea, unspecified: Secondary | ICD-10-CM

## 2021-08-26 DIAGNOSIS — I10 Essential (primary) hypertension: Secondary | ICD-10-CM

## 2021-08-26 DIAGNOSIS — I509 Heart failure, unspecified: Secondary | ICD-10-CM

## 2021-08-26 DIAGNOSIS — N1832 Chronic kidney disease, stage 3b: Secondary | ICD-10-CM

## 2021-08-26 DIAGNOSIS — R935 Abnormal findings on diagnostic imaging of other abdominal regions, including retroperitoneum: Secondary | ICD-10-CM

## 2021-08-26 DIAGNOSIS — R011 Cardiac murmur, unspecified: Secondary | ICD-10-CM

## 2021-08-26 DIAGNOSIS — R9431 Abnormal electrocardiogram [ECG] [EKG]: Secondary | ICD-10-CM | POA: Diagnosis not present

## 2021-08-26 DIAGNOSIS — F339 Major depressive disorder, recurrent, unspecified: Secondary | ICD-10-CM

## 2021-08-26 DIAGNOSIS — R600 Localized edema: Secondary | ICD-10-CM | POA: Diagnosis not present

## 2021-08-26 DIAGNOSIS — R748 Abnormal levels of other serum enzymes: Secondary | ICD-10-CM

## 2021-08-26 LAB — CBC WITH DIFFERENTIAL/PLATELET
Basophils Absolute: 0.1 10*3/uL (ref 0.0–0.1)
Basophils Relative: 1 % (ref 0.0–3.0)
Eosinophils Absolute: 0.5 10*3/uL (ref 0.0–0.7)
Eosinophils Relative: 8.9 % — ABNORMAL HIGH (ref 0.0–5.0)
HCT: 33.5 % — ABNORMAL LOW (ref 36.0–46.0)
Hemoglobin: 11.2 g/dL — ABNORMAL LOW (ref 12.0–15.0)
Lymphocytes Relative: 33.7 % (ref 12.0–46.0)
Lymphs Abs: 1.8 10*3/uL (ref 0.7–4.0)
MCHC: 33.3 g/dL (ref 30.0–36.0)
MCV: 87.3 fl (ref 78.0–100.0)
Monocytes Absolute: 0.4 10*3/uL (ref 0.1–1.0)
Monocytes Relative: 7.6 % (ref 3.0–12.0)
Neutro Abs: 2.6 10*3/uL (ref 1.4–7.7)
Neutrophils Relative %: 48.8 % (ref 43.0–77.0)
Platelets: 215 10*3/uL (ref 150.0–400.0)
RBC: 3.84 Mil/uL — ABNORMAL LOW (ref 3.87–5.11)
RDW: 15 % (ref 11.5–15.5)
WBC: 5.4 10*3/uL (ref 4.0–10.5)

## 2021-08-26 LAB — COMPREHENSIVE METABOLIC PANEL
ALT: 37 U/L — ABNORMAL HIGH (ref 0–35)
AST: 28 U/L (ref 0–37)
Albumin: 4.1 g/dL (ref 3.5–5.2)
Alkaline Phosphatase: 172 U/L — ABNORMAL HIGH (ref 39–117)
BUN: 21 mg/dL (ref 6–23)
CO2: 28 mEq/L (ref 19–32)
Calcium: 9.3 mg/dL (ref 8.4–10.5)
Chloride: 102 mEq/L (ref 96–112)
Creatinine, Ser: 1.06 mg/dL (ref 0.40–1.20)
GFR: 56.75 mL/min — ABNORMAL LOW (ref >60.00)
Glucose, Bld: 104 mg/dL — ABNORMAL HIGH (ref 70–99)
Potassium: 3.8 mEq/L (ref 3.5–5.1)
Sodium: 137 mEq/L (ref 135–145)
Total Bilirubin: 0.5 mg/dL (ref 0.2–1.2)
Total Protein: 7.1 g/dL (ref 6.0–8.3)

## 2021-08-26 LAB — D-DIMER, QUANTITATIVE: D-Dimer, Quant: 1.29 mcg/mL FEU — ABNORMAL HIGH (ref <0.50)

## 2021-08-26 LAB — TROPONIN I (HIGH SENSITIVITY): High Sens Troponin I: 5 ng/L (ref 2–17)

## 2021-08-26 LAB — BRAIN NATRIURETIC PEPTIDE: Pro B Natriuretic peptide (BNP): 6 pg/mL (ref 0.0–100.0)

## 2021-08-26 NOTE — Patient Instructions (Signed)
STAT labs today - please have phone on hand if we need to call you regarding any abnormalities  If any sudden severe shortness of breath or if any chest pain develops, go right to the ED  Urgent referral to cardiology  Out of work through Aug 21    If you develop suicidal thoughts, please tell someone and immediately proceed to our local 24/7 crisis center, [facility].[address], [place], [place] [phone].

## 2021-08-26 NOTE — Progress Notes (Signed)
Subjective:    Patient ID: Alexandria Morrison, female    DOB: 04/28/1960, 61 y.o.   MRN: [id number]  Chief Complaint  Patient presents with   Follow-up    Pt in for 4 wk f/u; pt states ankle is still hurting and causing issues; pt has been feeling exhausted and tired. Hard to rest at night with ankle pain; pt states lasix isn't really helping with fluid either and standing on feet 12+ hrs has been hard;    HPI Patient is in today for 4 week f/up from 07/29/21. Taking Lasix 20 mg daily, but still having swelling in her legs. Feeling more tired. More short of breath with exertion recently. No chest pain or palpitations. Feeling very depressed, high screening today - no plans or attempts, but has had thoughts of not 'what if I wasn't here.' Very exhausted from work, more difficult to go in every day. Difficult and painful to stand all day.   Past Medical History:  Diagnosis Date   Abnormal EKG    Cardiac murmur    CKD (chronic kidney disease)    Dyspnea    Elevated liver enzymes    HTN (hypertension)    Iron deficiency anemia    Lower extremity edema    Obesity     Past Surgical History:  Procedure Laterality Date   ABDOMINAL HYSTERECTOMY     TUBAL LIGATION      Family History  Problem Relation Age of Onset   Dementia Mother    Heart attack Father    Dementia Father    Hypertension Sister    Diabetes Brother    CVA Brother     Social History   Tobacco Use   Smoking status: Never   Smokeless tobacco: Never  Substance Use Topics   Alcohol use: No    Alcohol/week: 0.0 standard drinks of alcohol   Drug use: No     No Known Allergies  Review of Systems NEGATIVE UNLESS OTHERWISE INDICATED IN HPI      Objective:     BP 110/70 (BP Location: Left Arm)   Pulse 88   Temp 97.6 F (36.4 C) (Temporal)   Ht 5\' 2"  (1.575 m)   Wt 241 lb 3.2 oz (109.4 kg)   SpO2 99%   BMI 44.12 kg/m   Wt Readings from Last 3 Encounters:  08/26/21 241 lb 3.2 oz (109.4 kg)  07/29/21  233 lb 9.6 oz (106 kg)  06/03/21 229 lb (103.9 kg)    BP Readings from Last 3 Encounters:  08/26/21 110/70  07/29/21 (!) 160/98  06/03/21 125/89     Physical Exam Vitals and nursing note reviewed.  Constitutional:      Appearance: Normal appearance. She is obese. She is not toxic-appearing.  HENT:     Head: Normocephalic and atraumatic.     Right Ear: External ear normal.     Left Ear: External ear normal.     Nose: Nose normal.     Mouth/Throat:     Mouth: Mucous membranes are moist.  Eyes:     Extraocular Movements: Extraocular movements intact.     Conjunctiva/sclera: Conjunctivae normal.     Pupils: Pupils are equal, round, and reactive to light.  Cardiovascular:     Rate and Rhythm: Normal rate and regular rhythm.     Pulses: Normal pulses.     Heart sounds: Murmur heard.     Systolic murmur is present with a grade of 2/6.  Pulmonary:  Effort: Pulmonary effort is normal.     Breath sounds: Normal breath sounds.  Abdominal:     General: Abdomen is flat. Bowel sounds are normal.     Palpations: Abdomen is soft.  Musculoskeletal:        General: Normal range of motion.     Cervical back: Normal range of motion and neck supple.     Right lower leg: 2+ Edema present.     Left lower leg: 2+ Edema present.  Skin:    General: Skin is warm and dry.  Neurological:     General: No focal deficit present.     Mental Status: She is alert and oriented to person, place, and time.  Psychiatric:        Mood and Affect: Mood is depressed. Mood is not anxious.        Assessment & Plan:   Problem List Items Addressed This Visit       Other   Morbid obesity (HCC)   Relevant Orders   EKG 12-Lead (Completed)   Other Visit Diagnoses     Primary hypertension    -  Primary   Relevant Orders   EKG 12-Lead (Completed)   Dyspnea, unspecified type       Relevant Orders   CBC with Differential/Platelet (Completed)   Comprehensive metabolic panel (Completed)   B Nat  Peptide (Completed)   Troponin I (High Sensitivity) (Completed)   D-Dimer, Quantitative (Completed)   Other iron deficiency anemia       Elevated liver enzymes       Bilateral lower extremity edema       Relevant Orders   EKG 12-Lead (Completed)   Ambulatory referral to Cardiology   CKD stage G3b/A1, GFR 30-44 and albumin creatinine ratio <30 mg/g (HCC)       Abnormal [place] (ultrasound) of abdomen       Cardiac murmur       Relevant Orders   EKG 12-Lead (Completed)   CBC with Differential/Platelet (Completed)   Comprehensive metabolic panel (Completed)   B Nat Peptide (Completed)   Troponin I (High Sensitivity) (Completed)   D-Dimer, Quantitative (Completed)   Congestive heart failure, unspecified HF chronicity, unspecified heart failure type (HCC)       Relevant Orders   Ambulatory referral to Cardiology   CBC with Differential/Platelet (Completed)   Comprehensive metabolic panel (Completed)   B Nat Peptide (Completed)   Troponin I (High Sensitivity) (Completed)   D-Dimer, Quantitative (Completed)   Abnormal EKG       Relevant Orders   Ambulatory referral to Cardiology   CBC with Differential/Platelet (Completed)   Comprehensive metabolic panel (Completed)   B Nat Peptide (Completed)   Troponin I (High Sensitivity) (Completed)   D-Dimer, Quantitative (Completed)   Recurrent major depressive disorder, remission status unspecified (HCC)            1. Primary hypertension Losartan 25 mg daily to help with blood pressure control - doing well, stable   2. Other iron deficiency anemia Repeat labs today FOB test advised   ---Taking iron pills with Vitamin C, doesn't have any complaints  3. Elevated liver enzymes Have again stressed importance of f/up with liver specialists, info provided for her to contact them for appt   4. Bilateral lower extremity edema Patient's ongoing biggest concern Plan to increase Lasix 20 mg to BID   5. CKD stage G3b/A1, GFR 30-44 and albumin  creatinine ratio <30 mg/g (HCC) Repeat labs   6.  Morbid obesity (HCC) Advised working on weight loss   7. Abnormal [place] (ultrasound) of abdomen 03/03/21 [place] abd RUQ impression  1. Noted within the gallbladder is a stable appearing approximately 8 mm echogenic focus consistent with tiny gallstone or polyp. No interim change from prior ultrasound 08/16/2017. No evidence of cholecystitis or biliary distention.   2. Heterogeneous hepatic parenchymal pattern consistent fatty infiltration and or hepatocellular disease. No focal hepatic abnormality identified.   Plan for CT abd/pelvis (for LFTs, closer look at liver, and ongoing decrease in renal function)  ---This is scheduled for 09/08/21. No abdominal pain. No n/v/d. No constipation.   8. Cardiac murmur Again advised ECHO strongly recommended - could account for her worsening BLE edema and need to look into murmur closer  ----STILL does not have appt for this. SOB with going upstair. No CP. Usually sleeps on side at night. CXR on 08/29/20 showed cardiomegaly and vascular congestion. 8 lb weight gain in 4 weeks. Very concerned about fluid overload. ---Will plan for stat labs today with increase complaints of SOB. Advised on ER precautions ---EKG in office today - showed NSR 74 bpm; no acute signs of ischemia  9. Depression -No active SI, no plans or attempts; depressed from work and health concerns; plan to take her out of work to address her health needs closer; need to consider counseling +/- SSRI    Close f/up with me in next 4 weeks  This note was prepared with assistance of Dragon voice recognition software. Occasional wrong-word or sound-a-like substitutions may have occurred due to the inherent limitations of voice recognition software.  Time Spent: 31 minutes of total time was spent on the date of the encounter performing the following actions: chart review prior to seeing the patient, obtaining history, performing a medically  necessary exam, counseling on the treatment plan, placing orders, and documenting in our EHR.       Aizen Duval M Kenlee Vogt, PA-C

## 2021-08-27 ENCOUNTER — Other Ambulatory Visit: Payer: Self-pay | Admitting: Physician Assistant

## 2021-08-27 ENCOUNTER — Ambulatory Visit (HOSPITAL_COMMUNITY)
Admission: RE | Admit: 2021-08-27 | Discharge: 2021-08-27 | Disposition: A | Discharge status: Home / Self Care | Payer: BC Managed Care – PPO | Source: Ambulatory Visit | Attending: Physician Assistant | Admitting: Physician Assistant

## 2021-08-27 ENCOUNTER — Other Ambulatory Visit: Payer: Self-pay

## 2021-08-27 ENCOUNTER — Telehealth: Payer: Self-pay | Admitting: Physician Assistant

## 2021-08-27 DIAGNOSIS — R0600 Dyspnea, unspecified: Secondary | ICD-10-CM | POA: Insufficient documentation

## 2021-08-27 DIAGNOSIS — R600 Localized edema: Secondary | ICD-10-CM | POA: Insufficient documentation

## 2021-08-27 DIAGNOSIS — R1900 Intra-abdominal and pelvic swelling, mass and lump, unspecified site: Secondary | ICD-10-CM

## 2021-08-27 DIAGNOSIS — R7989 Other specified abnormal findings of blood chemistry: Secondary | ICD-10-CM | POA: Diagnosis not present

## 2021-08-27 MED ORDER — IOHEXOL 350 MG/ML SOLN
70.0000 mL | Freq: Once | INTRAVENOUS | Status: AC | PRN
  Administered 2021-08-27: 70 mL via INTRAVENOUS

## 2021-08-27 MED ORDER — FUROSEMIDE 20 MG PO TABS: 20.0000 mg | ORAL_TABLET | Freq: Two times a day (BID) | ORAL | 3 refills | Status: DC

## 2021-08-27 NOTE — Telephone Encounter (Signed)
Pt states: -returning a call for Alexandria Morrison -MRI in [place] for an earlier appt date is preferred by pt -she was told that MRIs in [place] would take at least a week.   Pt requests: -call about scheduling the appointment.

## 2021-08-29 ENCOUNTER — Ambulatory Visit
Admission: RE | Admit: 2021-08-29 | Discharge: 2021-08-29 | Disposition: A | Discharge status: Home / Self Care | Payer: BC Managed Care – PPO | Source: Ambulatory Visit | Attending: Physician Assistant | Admitting: Physician Assistant

## 2021-08-29 DIAGNOSIS — N281 Cyst of kidney, acquired: Secondary | ICD-10-CM | POA: Diagnosis not present

## 2021-08-29 DIAGNOSIS — K8689 Other specified diseases of pancreas: Secondary | ICD-10-CM | POA: Diagnosis not present

## 2021-08-29 DIAGNOSIS — R1900 Intra-abdominal and pelvic swelling, mass and lump, unspecified site: Secondary | ICD-10-CM

## 2021-08-29 DIAGNOSIS — Z90710 Acquired absence of both cervix and uterus: Secondary | ICD-10-CM | POA: Diagnosis not present

## 2021-08-29 DIAGNOSIS — R160 Hepatomegaly, not elsewhere classified: Secondary | ICD-10-CM | POA: Diagnosis not present

## 2021-08-29 MED ORDER — GADOBUTROL 1 MMOL/ML IV SOLN
10.0000 mL | Freq: Once | INTRAVENOUS | Status: AC | PRN
  Administered 2021-08-29: 10 mL via INTRAVENOUS

## 2021-08-30 NOTE — Telephone Encounter (Signed)
Imaging was already done.

## 2021-09-01 ENCOUNTER — Telehealth: Payer: Self-pay | Admitting: Physician Assistant

## 2021-09-01 ENCOUNTER — Other Ambulatory Visit: Payer: Self-pay

## 2021-09-01 DIAGNOSIS — R160 Hepatomegaly, not elsewhere classified: Secondary | ICD-10-CM

## 2021-09-01 NOTE — Telephone Encounter (Signed)
Patient states Provider will be receiving a fax or e-mail from [facility] for short term disability.  Patient also request to be contacted at ph# [phone] for results of MRI DO[facility] 08/29/21.

## 2021-09-02 NOTE — Telephone Encounter (Signed)
Spoke with pt and advised not received fax from [facility] as of 09/02/21

## 2021-09-06 ENCOUNTER — Telehealth: Payer: Self-pay | Admitting: Physician Assistant

## 2021-09-06 ENCOUNTER — Ambulatory Visit: Payer: BC Managed Care – PPO | Admitting: Internal Medicine

## 2021-09-06 ENCOUNTER — Encounter: Payer: Self-pay | Admitting: Internal Medicine

## 2021-09-06 VITALS — BP 112/70 | HR 88 | Ht 62.0 in | Wt 245.0 lb

## 2021-09-06 DIAGNOSIS — Z8249 Family history of ischemic heart disease and other diseases of the circulatory system: Secondary | ICD-10-CM | POA: Insufficient documentation

## 2021-09-06 DIAGNOSIS — M25472 Effusion, left ankle: Secondary | ICD-10-CM

## 2021-09-06 DIAGNOSIS — R011 Cardiac murmur, unspecified: Secondary | ICD-10-CM | POA: Insufficient documentation

## 2021-09-06 DIAGNOSIS — M25471 Effusion, right ankle: Secondary | ICD-10-CM

## 2021-09-06 DIAGNOSIS — R600 Localized edema: Secondary | ICD-10-CM | POA: Diagnosis not present

## 2021-09-06 MED ORDER — POTASSIUM CHLORIDE CRYS ER 20 MEQ PO TBCR: 40.0000 meq | EXTENDED_RELEASE_TABLET | Freq: Every day | ORAL | 3 refills | Status: AC

## 2021-09-06 MED ORDER — FUROSEMIDE 40 MG PO TABS: 60.0000 mg | ORAL_TABLET | Freq: Every day | ORAL | 3 refills | Status: DC

## 2021-09-06 MED ORDER — FUROSEMIDE 20 MG PO TABS: 60.0000 mg | ORAL_TABLET | Freq: Every day | ORAL | 3 refills | Status: DC

## 2021-09-06 NOTE — Telephone Encounter (Signed)
Patient states: - She was returning Kela's call in reference to a fax from [employer]  -She was informed by [employer] that they faxed over some paperwork for PCP to complete on 8/14   I informed patient of Kela's message stating we have not received such a fax. I recommended she call them to verify our fax number and have them resend it. Pt states she has done this already.   Patient states she will check back tomorrow and if we have not received the fax she will ask them to send it through once more.

## 2021-09-06 NOTE — Patient Instructions (Signed)
Medication Instructions:  Your physician has recommended you make the following change in your medication:  STOP: furosemide 20 mg twice daily START: furosemide 60 mg by mouth once daily  STOP: Potassium Chloride 20 mEq daily START: Potassium Chloride 40 mEq daily  *If you need a refill on your cardiac medications before your next appointment, please call your pharmacy*   Lab Work: IN 7-10 days: BNP, Mg If you have labs (blood work) drawn today and your tests are completely normal, you will receive your results only by: MyChart Message (if you have MyChart) OR A paper copy in the mail If you have any lab test that is abnormal or we need to change your treatment, we will call you to review the results.   Testing/Procedures: Your physician has requested that you have a lower extremity venous duplex. This test is an ultrasound of the veins in the legs. It looks at venous blood flow that carries blood from the heart to the legs.  Allow thirty minutes for an Upper Venous exam. There are no restrictions or special instructions.  Your physician has requested that you have an echocardiogram. Echocardiography is a painless test that uses sound waves to create images of your heart. It provides your doctor with information about the size and shape of your heart and how well your heart's chambers and valves are working. This procedure takes approximately one hour. There are no restrictions for this procedure.      Follow-Up: At [facility], you and your health needs are our priority.  As part of our continuing mission to provide you with exceptional heart care, we have created designated Provider Care Teams.  These Care Teams include your primary Cardiologist (physician) and Advanced Practice Providers (APPs -  Physician Assistants and Nurse Practitioners) who all work together to provide you with the care you need, when you need it.   Your next appointment:   2 month(s)  The format for your  next appointment:   In Person  Provider:   Christell Constant, MD     Important Information About Sugar

## 2021-09-06 NOTE — Progress Notes (Signed)
Cardiology Office Note:    Date:  09/06/2021   ID:  Alexandria Morrison, DOB 1960-02-22, MRN [id number]  PCP:  Allwardt, Crist Infante, PA-C   [facility] Providers Cardiologist:  Christell Constant, MD     Referring MD: Bary Leriche, PA-C   CC: Ankle swelling Consulted for the evaluation of LE edema at the behest of [name] PA-C  History of Present Illness:    Alexandria Morrison is a 61 y.o. female with a hx of HTN, Morbid Obesity, LE edema, and IDA who presents for evaluation.   Patient notes that she is feeling ok.  She works a twelve hour shift and sometimes has painful leg swelling. This has gone on for over a year.  Otherwise feels well.  Does feel tired.  Feels like she has no energy  Saw PCP.  Positive D dimer.  Had CTPE that was negative but saw a pancreas nodule; has MRI on 08/29/21.  Normal BNP 08/28/21.Has There is no plan to scan her legs at this time for DVT.  Has had no chest pain, chest pressure, chest tightness, chest stinging.  Patient exertion notable for working in [employer] and feels no symptoms.  DOE with steps only  No shortness of breath.  No PND or orthopnea.  Notes weight gain, leg swelling , and abdominal swelling.  No syncope or near syncope . Notes  no palpitations or funny heart beats.     Patient reports prior cardiac testing including negative CTPE 08/2021  Hates compression stockings due to discomfort.   Past Medical History:  Diagnosis Date   Abnormal EKG    Cardiac murmur    CKD (chronic kidney disease)    Dyspnea    Elevated liver enzymes    HTN (hypertension)    Iron deficiency anemia    Lower extremity edema    Obesity     Past Surgical History:  Procedure Laterality Date   ABDOMINAL HYSTERECTOMY     TUBAL LIGATION      Current Medications: Current Meds  Medication Sig   cholecalciferol (VITAMIN D3) 25 MCG (1000 UNIT) tablet Take 1,000 Units by mouth daily.   ferrous sulfate 325 (65 FE) MG tablet  Take 1 tablet (325 mg total) by mouth every other day. Take with Vit C.   furosemide (LASIX) 20 MG tablet Take 3 tablets (60 mg total) by mouth daily.   gabapentin (NEURONTIN) 300 MG capsule Take 1 capsule by mouth at bedtime   losartan (COZAAR) 25 MG tablet Take 1 tablet by mouth once daily   magnesium 30 MG tablet Take 30 mg by mouth 2 (two) times daily.   potassium chloride SA (KLOR-CON M) 20 MEQ tablet Take 2 tablets (40 mEq total) by mouth daily.   vitamin B-12 (CYANOCOBALAMIN) 1000 MCG tablet Take 1,000 mcg by mouth daily.   Vitamin D, Ergocalciferol, (DRISDOL) 1.25 MG (50000 UNIT) CAPS capsule Take 1 capsule (50,000 Units total) by mouth every 7 (seven) days.   [DISCONTINUED] furosemide (LASIX) 20 MG tablet Take 1 tablet (20 mg total) by mouth 2 (two) times daily. Take to help reduce swelling in legs.   [DISCONTINUED] furosemide (LASIX) 40 MG tablet Take 1.5 tablets (60 mg total) by mouth daily.   [DISCONTINUED] potassium chloride SA (KLOR-CON M) 20 MEQ tablet Take 1 tablet by mouth once daily     Allergies:   Patient has no known allergies.   Social History   Socioeconomic History   Marital status:  Single    Spouse name: Not on file   Number of children: Not on file   Years of education: Not on file   Highest education level: Not on file  Occupational History   Not on file  Tobacco Use   Smoking status: Never   Smokeless tobacco: Never  Substance and Sexual Activity   Alcohol use: No    Alcohol/week: 0.0 standard drinks of alcohol   Drug use: No   Sexual activity: Not on file  Other Topics Concern   Not on file  Social History Narrative   Not on file   Social Determinants of Health   Financial Resource Strain: Not on file  Food Insecurity: Not on file  Transportation Needs: Not on file  Physical Activity: Not on file  Stress: Not on file  Social Connections: Not on file     Family History: The patient's family history includes CVA in her brother; Dementia in  her father and mother; Diabetes in her brother; Heart attack in her father; Hypertension in her sister.  Brother died of heart disease at age of 54; unclear cardiac etiology  ROS:   Please see the history of present illness.     All other systems reviewed and are negative.  EKGs/Labs/Other Studies Reviewed:    The following studies were reviewed today:  EKG:  EKG is  ordered today.  The ekg ordered today demonstrates  SR inferior MI  Recent Labs: 01/29/2021: TSH 1.49 08/26/2021: ALT 37; BUN 21; Creatinine, Ser 1.06; Hemoglobin 11.2; Platelets 215.0; Potassium 3.8; Pro B Natriuretic peptide (BNP) 6.0; Sodium 137  Recent Lipid Panel    Component Value Date/Time   CHOL 251 (H) 11/06/2020 1012   TRIG 102.0 11/06/2020 1012   HDL 76.30 11/06/2020 1012   CHOLHDL 3 11/06/2020 1012   VLDL 20.4 11/06/2020 1012   LDLCALC 154 (H) 11/06/2020 1012         Physical Exam:    VS:  BP 112/70 (BP Location: Left Arm, Patient Position: Sitting, Cuff Size: Normal)   Pulse 88   Ht 5\' 2"  (1.575 m)   Wt 245 lb (111.1 kg)   SpO2 95%   BMI 44.81 kg/m     Wt Readings from Last 3 Encounters:  09/06/21 245 lb (111.1 kg)  08/26/21 241 lb 3.2 oz (109.4 kg)  07/29/21 233 lb 9.6 oz (106 kg)    Gen: no distress, morbid obesity   Neck: No JVD Cardiac: No Rubs or Gallops, hasrh systolic murmur, RRR +2 radial pulses Respiratory: Clear to auscultation bilaterally, normal effort, normal  respiratory rate GI: Soft, nontender, non-distended  MS: +2 bilateral edema; moves all extremities Integument: Skin feels  Neuro:  At time of evaluation, alert and oriented to person/place/time/situation Psych: Normal affect, patient feels well   ASSESSMENT:    1. Bilateral lower extremity edema   2. Systolic murmur   3. Family history of cardiomyopathy   4. Morbid obesity (HCC)   5. Ankle edema, bilateral    PLAN:    Positive D dimer new leg swelling - will complete work up with LE duplex  Fhx of  cardiomyopathy New Heart murmur LE edema Hypokalemia Hypomagnesemia - she is a shift worker and needs daily lasix: increase to 60 mg PO daily, K of 40 meq, continue her home mag, BMP and Mg in 7-10 days - Will Get echo Discussed zip up compression stockings - 2 month f/u unless atypical echo findings; f/u with me  Medication Adjustments/Labs and Tests Ordered: Current medicines are reviewed at length with the patient today.  Concerns regarding medicines are outlined above.  Orders Placed This Encounter  Procedures   Pro b natriuretic peptide (BNP)   Magnesium   ECHOCARDIOGRAM COMPLETE   VAS [place] LOWER EXTREMITY VENOUS (DVT)   Meds ordered this encounter  Medications   DISCONTD: furosemide (LASIX) 40 MG tablet    Sig: Take 1.5 tablets (60 mg total) by mouth daily.    Dispense:  135 tablet    Refill:  3   potassium chloride SA (KLOR-CON M) 20 MEQ tablet    Sig: Take 2 tablets (40 mEq total) by mouth daily.    Dispense:  180 tablet    Refill:  3   furosemide (LASIX) 20 MG tablet    Sig: Take 3 tablets (60 mg total) by mouth daily.    Dispense:  252 tablet    Refill:  3    Patient Instructions  Medication Instructions:  Your physician has recommended you make the following change in your medication:  STOP: furosemide 20 mg twice daily START: furosemide 60 mg by mouth once daily  STOP: Potassium Chloride 20 mEq daily START: Potassium Chloride 40 mEq daily  *If you need a refill on your cardiac medications before your next appointment, please call your pharmacy*   Lab Work: IN 7-10 days: BNP, Mg If you have labs (blood work) drawn today and your tests are completely normal, you will receive your results only by: MyChart Message (if you have MyChart) OR A paper copy in the mail If you have any lab test that is abnormal or we need to change your treatment, we will call you to review the results.   Testing/Procedures: Your physician has requested that you have a  lower extremity venous duplex. This test is an ultrasound of the veins in the legs. It looks at venous blood flow that carries blood from the heart to the legs.  Allow thirty minutes for an Upper Venous exam. There are no restrictions or special instructions.  Your physician has requested that you have an echocardiogram. Echocardiography is a painless test that uses sound waves to create images of your heart. It provides your doctor with information about the size and shape of your heart and how well your heart's chambers and valves are working. This procedure takes approximately one hour. There are no restrictions for this procedure.      Follow-Up: At [facility], you and your health needs are our priority.  As part of our continuing mission to provide you with exceptional heart care, we have created designated Provider Care Teams.  These Care Teams include your primary Cardiologist (physician) and Advanced Practice Providers (APPs -  Physician Assistants and Nurse Practitioners) who all work together to provide you with the care you need, when you need it.   Your next appointment:   2 month(s)  The format for your next appointment:   In Person  Provider:   Christell Constant, MD     Important Information About Sugar         Signed, Christell Constant, MD  09/06/2021 4:14 PM    [facility]

## 2021-09-07 ENCOUNTER — Telehealth: Payer: Self-pay | Admitting: Physician Assistant

## 2021-09-07 DIAGNOSIS — Z0279 Encounter for issue of other medical certificate: Secondary | ICD-10-CM

## 2021-09-07 NOTE — Telephone Encounter (Signed)
.  Type of form received: FMLA  Additional comments:   Received by: Christie  Form should be Faxed to: [phone]  Form should be mailed to:    Is patient requesting call for pickup:   Form placed:  In provider's box  Attach charge sheet. yes  Individual made aware of 3-5 business day turn around (Y/N)?   

## 2021-09-07 NOTE — Telephone Encounter (Signed)
PT FMLA Paperwork received 09/07/2021 @ 2:45pm

## 2021-09-09 ENCOUNTER — Other Ambulatory Visit: Payer: BC Managed Care – PPO

## 2021-09-10 NOTE — Telephone Encounter (Signed)
Called pt regarding portion of paperwork needing to be completed, lvm to return my call at her convenience.

## 2021-09-14 ENCOUNTER — Other Ambulatory Visit: Payer: BC Managed Care – PPO

## 2021-09-14 ENCOUNTER — Ambulatory Visit (HOSPITAL_COMMUNITY)
Admission: RE | Admit: 2021-09-14 | Discharge: 2021-09-14 | Disposition: A | Discharge status: Home / Self Care | Payer: BC Managed Care – PPO | Source: Ambulatory Visit | Attending: Cardiovascular Disease | Admitting: Cardiovascular Disease

## 2021-09-14 DIAGNOSIS — R600 Localized edema: Secondary | ICD-10-CM

## 2021-09-14 DIAGNOSIS — R011 Cardiac murmur, unspecified: Secondary | ICD-10-CM

## 2021-09-15 LAB — MAGNESIUM: Magnesium: 2.3 mg/dL (ref 1.6–2.3)

## 2021-09-15 LAB — PRO B NATRIURETIC PEPTIDE: NT-Pro BNP: <36 pg/mL (ref 0–287)

## 2021-09-16 NOTE — Telephone Encounter (Signed)
Paperwork was completed and faxed for patient on 09/10/2021

## 2021-09-23 ENCOUNTER — Ambulatory Visit (HOSPITAL_COMMUNITY): Payer: BC Managed Care – PPO | Attending: Internal Medicine

## 2021-09-23 DIAGNOSIS — R600 Localized edema: Secondary | ICD-10-CM | POA: Diagnosis not present

## 2021-09-23 DIAGNOSIS — R011 Cardiac murmur, unspecified: Secondary | ICD-10-CM | POA: Insufficient documentation

## 2021-09-23 LAB — ECHOCARDIOGRAM COMPLETE
Area-P 1/2: 4.57 cm2
S' Lateral: 2.5 cm

## 2021-09-24 ENCOUNTER — Telehealth: Payer: Self-pay

## 2021-09-24 DIAGNOSIS — R600 Localized edema: Secondary | ICD-10-CM

## 2021-09-24 DIAGNOSIS — R011 Cardiac murmur, unspecified: Secondary | ICD-10-CM

## 2021-09-24 NOTE — Telephone Encounter (Signed)
-----   Message from Christell Constant, MD sent at 09/23/2021  4:04 PM EDT ----- Results: Mild increase in fluid with normal heart function Plan: Need to get BMP results from 09/13/21, then likely increase lasix to 40 mg PO BID  Christell Constant, MD

## 2021-09-24 NOTE — Telephone Encounter (Signed)
The patient has been notified of the result and verbalized understanding.  All questions (if any) were answered. Macie Burows, RN 09/24/2021 10:11 AM   BMP scheduled for 09/28/21

## 2021-09-28 ENCOUNTER — Ambulatory Visit: Payer: BC Managed Care – PPO | Attending: Internal Medicine

## 2021-09-28 DIAGNOSIS — R011 Cardiac murmur, unspecified: Secondary | ICD-10-CM

## 2021-09-28 DIAGNOSIS — R600 Localized edema: Secondary | ICD-10-CM

## 2021-09-28 LAB — BASIC METABOLIC PANEL
BUN/Creatinine Ratio: 17 (ref 12–28)
BUN: 22 mg/dL (ref 8–27)
CO2: 23 mmol/L (ref 20–29)
Calcium: 9.7 mg/dL (ref 8.7–10.3)
Chloride: 99 mmol/L (ref 96–106)
Creatinine, Ser: 1.33 mg/dL — ABNORMAL HIGH (ref 0.57–1.00)
Glucose: 115 mg/dL — ABNORMAL HIGH (ref 70–99)
Potassium: 3.9 mmol/L (ref 3.5–5.2)
Sodium: 138 mmol/L (ref 134–144)
eGFR: 46 mL/min/{1.73_m2} — ABNORMAL LOW (ref >59)

## 2021-09-30 ENCOUNTER — Telehealth: Payer: Self-pay | Admitting: Physician Assistant

## 2021-09-30 ENCOUNTER — Other Ambulatory Visit: Payer: Self-pay

## 2021-09-30 MED ORDER — GABAPENTIN 300 MG PO CAPS: 300.0000 mg | ORAL_CAPSULE | Freq: Every day | ORAL | 3 refills | Status: DC

## 2021-09-30 NOTE — Telephone Encounter (Signed)
..   Encourage patient to contact the pharmacy for refills or they can request refills through [facility]  LAST APPOINTMENT DATE:  08/26/21  NEXT APPOINTMENT DATE: 10/06/21  MEDICATION: losartan (COZAAR) 25 MG tablet  gabapentin (NEURONTIN) 300 MG capsule   Is the patient out of medication? Will be out in 2 days   PHARMACY: [facility] [address] [place] - [address]  [address] [place] [address]  Phone:  [phone]  Fax:  [phone]   Patient states: - She was unsure if she needed to continue with both medications listed above or not. She is scheduled for follow up on 10/06/21 @ 2pm.

## 2021-09-30 NOTE — Telephone Encounter (Signed)
Rx sent to pharmacy   

## 2021-10-01 ENCOUNTER — Other Ambulatory Visit: Payer: Self-pay | Admitting: Physician Assistant

## 2021-10-01 ENCOUNTER — Other Ambulatory Visit: Payer: Self-pay

## 2021-10-01 MED ORDER — LOSARTAN POTASSIUM 25 MG PO TABS: 25.0000 mg | ORAL_TABLET | Freq: Every day | ORAL | 0 refills | Status: DC

## 2021-10-01 NOTE — Telephone Encounter (Signed)
Called pt to advise Rx was sent, apologized it was not sent the first time. LVM to return call if anything else was needed

## 2021-10-06 ENCOUNTER — Ambulatory Visit: Payer: BC Managed Care – PPO | Admitting: Physician Assistant

## 2021-10-06 ENCOUNTER — Encounter: Payer: Self-pay | Admitting: Physician Assistant

## 2021-10-06 VITALS — BP 118/76 | HR 85 | Temp 98.5°F | Ht 62.0 in | Wt 244.6 lb

## 2021-10-06 DIAGNOSIS — D508 Other iron deficiency anemias: Secondary | ICD-10-CM

## 2021-10-06 DIAGNOSIS — R600 Localized edema: Secondary | ICD-10-CM

## 2021-10-06 DIAGNOSIS — F339 Major depressive disorder, recurrent, unspecified: Secondary | ICD-10-CM

## 2021-10-06 MED ORDER — FLUOXETINE HCL 20 MG PO CAPS: ORAL_CAPSULE | ORAL | 0 refills | Status: DC

## 2021-10-06 NOTE — Patient Instructions (Signed)
Good to see you today!  Please return stool cards as soon as possible - need to make sure no microscopic blood in stool.  Start on Prozac 20 mg daily. Call if any concerns.  Keep up good work with compression stockings!

## 2021-10-06 NOTE — Progress Notes (Signed)
Subjective:    Patient ID: Alexandria Morrison, female    DOB: 11/02/1960, 61 y.o.   MRN: [id number]  Chief Complaint  Patient presents with   Follow-up    Pt in for f/u; pt has went to back to work wearing compression stockings but still hurts per patient, also caught change of weather cold; pt also c/o lower back pain    HPI Patient is in today for follow-up from previous visit.  She has been meeting with cardiology and this report was reviewed with her today.  She also tells me that she saw a liver specialist and was told she has fatty liver, it was her understanding that she did not need any follow-up with them.  Went back to work 09/15/21. Doing ok. Mental health is better, still having some down days. Never been treated for depression before, unsure of family hx. would be willing to start on antidepressant at this time.  Past Medical History:  Diagnosis Date   Abnormal EKG    Cardiac murmur    CKD (chronic kidney disease)    Dyspnea    Elevated liver enzymes    HTN (hypertension)    Iron deficiency anemia    Lower extremity edema    Obesity     Past Surgical History:  Procedure Laterality Date   ABDOMINAL HYSTERECTOMY     TUBAL LIGATION      Family History  Problem Relation Age of Onset   Dementia Mother    Heart attack Father    Dementia Father    Hypertension Sister    Diabetes Brother    CVA Brother     Social History   Tobacco Use   Smoking status: Never   Smokeless tobacco: Never  Substance Use Topics   Alcohol use: No    Alcohol/week: 0.0 standard drinks of alcohol   Drug use: No     No Known Allergies  Review of Systems NEGATIVE UNLESS OTHERWISE INDICATED IN HPI      Objective:     BP 118/76 (BP Location: Right Arm)   Pulse 85   Temp 98.5 F (36.9 C) (Temporal)   Ht 5\' 2"  (1.575 m)   Wt 244 lb 9.6 oz (110.9 kg)   SpO2 97%   BMI 44.74 kg/m   Wt Readings from Last 3 Encounters:  10/06/21 244 lb 9.6 oz (110.9 kg)  09/06/21 245 lb  (111.1 kg)  08/26/21 241 lb 3.2 oz (109.4 kg)    BP Readings from Last 3 Encounters:  10/06/21 118/76  09/06/21 112/70  08/26/21 110/70     Physical Exam Vitals and nursing note reviewed.  Constitutional:      Appearance: Normal appearance. She is obese. She is not toxic-appearing.  HENT:     Head: Normocephalic and atraumatic.  Eyes:     Extraocular Movements: Extraocular movements intact.     Conjunctiva/sclera: Conjunctivae normal.     Pupils: Pupils are equal, round, and reactive to light.  Cardiovascular:     Rate and Rhythm: Normal rate and regular rhythm.     Pulses: Normal pulses.     Heart sounds: Murmur heard.     Systolic murmur is present with a grade of 2/6.  Pulmonary:     Effort: Pulmonary effort is normal.     Breath sounds: Normal breath sounds.  Musculoskeletal:        General: Normal range of motion.     Cervical back: Normal range of motion and neck supple.  Right lower leg: 2+ Edema present.     Left lower leg: 2+ Edema present.  Skin:    General: Skin is warm and dry.  Neurological:     General: No focal deficit present.     Mental Status: She is alert and oriented to person, place, and time.  Psychiatric:        Mood and Affect: Mood and affect normal. Mood is not anxious or depressed.        Assessment & Plan:  Depression, recurrent (HCC)  Other iron deficiency anemia -     Fecal occult blood, imunochemical; Future  Bilateral lower extremity edema  Other orders -     FLUoxetine HCl; Take one capsule po prior to your shift at work.  Dispense: 30 capsule; Refill: 0   Encouraged the patient to keep up the good work.  She has certainly made some strides since last time I saw her.  Her blood pressure is looking good.  Her lower extremity edema is still present, but beginning to improve.  She does see improvement with the compression stockings and I encouraged her to keep up with this.  She is also continuing on Lasix daily. Other things  we discussed today were her depression.  She does not have any suicidal or homicidal ideations.  She agrees with starting on Prozac 20 mg daily to see if this can start helping her feel better.  Possible side effects discussed.  She still has not completed a fecal occult blood stool card, and this was provided for her again today with hopes that she can return this right away.  Still want to have close follow-up with me and she knows to reach out if any concerns, sooner.    Return in about 5 weeks (around 11/09/2021) for med recheck.  This note was prepared with assistance of Conservation officer, historic buildings. Occasional wrong-word or sound-a-like substitutions may have occurred due to the inherent limitations of voice recognition software.   Marjo Grosvenor M Dai Apel, PA-C

## 2021-10-18 ENCOUNTER — Encounter: Payer: Self-pay | Admitting: *Deleted

## 2021-10-21 ENCOUNTER — Other Ambulatory Visit: Payer: Self-pay | Admitting: Physician Assistant

## 2021-10-26 ENCOUNTER — Ambulatory Visit: Payer: BC Managed Care – PPO | Admitting: Internal Medicine

## 2021-10-26 ENCOUNTER — Other Ambulatory Visit (INDEPENDENT_AMBULATORY_CARE_PROVIDER_SITE_OTHER): Payer: BC Managed Care – PPO

## 2021-10-26 DIAGNOSIS — D508 Other iron deficiency anemias: Secondary | ICD-10-CM | POA: Diagnosis not present

## 2021-10-27 ENCOUNTER — Other Ambulatory Visit: Payer: Self-pay

## 2021-10-27 ENCOUNTER — Telehealth: Payer: Self-pay

## 2021-10-27 DIAGNOSIS — D509 Iron deficiency anemia, unspecified: Secondary | ICD-10-CM

## 2021-10-27 DIAGNOSIS — R195 Other fecal abnormalities: Secondary | ICD-10-CM

## 2021-10-27 LAB — FECAL OCCULT BLOOD, IMMUNOCHEMICAL: Fecal Occult Bld: POSITIVE — AB

## 2021-10-27 NOTE — Telephone Encounter (Signed)
Positive IFOB

## 2021-10-27 NOTE — Telephone Encounter (Signed)
Called pt and lvm with cb number, placed referral as advised by provider

## 2021-11-02 ENCOUNTER — Encounter: Payer: Self-pay | Admitting: Internal Medicine

## 2021-11-03 DIAGNOSIS — F32A Depression, unspecified: Secondary | ICD-10-CM | POA: Insufficient documentation

## 2021-11-03 DIAGNOSIS — I10 Essential (primary) hypertension: Secondary | ICD-10-CM | POA: Insufficient documentation

## 2021-11-03 DIAGNOSIS — F419 Anxiety disorder, unspecified: Secondary | ICD-10-CM | POA: Insufficient documentation

## 2021-11-03 DIAGNOSIS — K760 Fatty (change of) liver, not elsewhere classified: Secondary | ICD-10-CM | POA: Diagnosis not present

## 2021-11-03 DIAGNOSIS — R748 Abnormal levels of other serum enzymes: Secondary | ICD-10-CM | POA: Diagnosis not present

## 2021-11-03 DIAGNOSIS — N189 Chronic kidney disease, unspecified: Secondary | ICD-10-CM | POA: Insufficient documentation

## 2021-11-03 DIAGNOSIS — E8801 Alpha-1-antitrypsin deficiency: Secondary | ICD-10-CM | POA: Diagnosis not present

## 2021-11-03 DIAGNOSIS — D509 Iron deficiency anemia, unspecified: Secondary | ICD-10-CM | POA: Insufficient documentation

## 2021-11-03 DIAGNOSIS — R160 Hepatomegaly, not elsewhere classified: Secondary | ICD-10-CM | POA: Diagnosis not present

## 2021-11-03 DIAGNOSIS — G629 Polyneuropathy, unspecified: Secondary | ICD-10-CM | POA: Insufficient documentation

## 2021-11-07 NOTE — Progress Notes (Unsigned)
Cardiology Office Note:    Date:  11/08/2021   ID:  Alexandria Morrison, DOB 19-Jan-1961, MRN [id number]  PCP:  Allwardt, Crist Infante, PA-C   [facility] Providers Cardiologist:  Christell Constant, MD     Referring MD: Bary Leriche, PA-C   CC: Ankle swelling f/u  History of Present Illness:    Alexandria Morrison is a 61 y.o. female with a hx of HTN, Morbid Obesity, LE edema, and IDA who presents for evaluation.  2023: started on increased diuretic.  Negative LE duplex.    Patient notes that she is doing well.   Has the zip up compression stockings which hurt less. Swelling is better but persistent.  No chest pain or pressure.  Feels some congestion.  Still feels SOB. Is largely sedentary outside .  Weight is up a bit. Notes PND and orthopnea..  Notes weight gain and improved leg swelling.  No palpitations or syncope.   Past Medical History:  Diagnosis Date   Abnormal EKG    Cardiac murmur    CKD (chronic kidney disease)    Dyspnea    Elevated liver enzymes    HTN (hypertension)    Iron deficiency anemia    Lower extremity edema    Obesity     Past Surgical History:  Procedure Laterality Date   ABDOMINAL HYSTERECTOMY     TUBAL LIGATION      Current Medications: Current Meds  Medication Sig   cholecalciferol (VITAMIN D3) 25 MCG (1000 UNIT) tablet Take 1,000 Units by mouth daily.   empagliflozin (JARDIANCE) 10 MG TABS tablet Take 1 tablet (10 mg total) by mouth daily before breakfast.   ferrous sulfate 325 (65 FE) MG tablet Take 1 tablet (325 mg total) by mouth every other day. Take with Vit C.   FLUoxetine (PROZAC) 20 MG capsule Take one capsule po prior to your shift at work.   furosemide (LASIX) 20 MG tablet Take 3 tablets (60 mg total) by mouth daily.   gabapentin (NEURONTIN) 300 MG capsule Take 1 capsule (300 mg total) by mouth at bedtime.   losartan (COZAAR) 25 MG tablet Take 1 tablet by mouth once daily   magnesium 30 MG tablet Take 30 mg by mouth 2  (two) times daily.   potassium chloride SA (KLOR-CON M) 20 MEQ tablet Take 2 tablets (40 mEq total) by mouth daily.   vitamin B-12 (CYANOCOBALAMIN) 1000 MCG tablet Take 1,000 mcg by mouth daily.   Vitamin D, Ergocalciferol, (DRISDOL) 1.25 MG (50000 UNIT) CAPS capsule Take 1 capsule (50,000 Units total) by mouth every 7 (seven) days.     Allergies:   Patient has no known allergies.   Social History   Socioeconomic History   Marital status: Single    Spouse name: Not on file   Number of children: Not on file   Years of education: Not on file   Highest education level: Not on file  Occupational History   Not on file  Tobacco Use   Smoking status: Never   Smokeless tobacco: Never  Substance and Sexual Activity   Alcohol use: No    Alcohol/week: 0.0 standard drinks of alcohol   Drug use: No   Sexual activity: Not on file  Other Topics Concern   Not on file  Social History Narrative   Not on file   Social Determinants of Health   Financial Resource Strain: Not on file  Food Insecurity: Not on file  Transportation Needs: Not on  file  Physical Activity: Not on file  Stress: Not on file  Social Connections: Not on file     Family History: The patient's family history includes CVA in her brother; Dementia in her father and mother; Diabetes in her brother; Heart attack in her father; Hypertension in her sister.  Brother died of heart disease at age of 58; unclear cardiac etiology  ROS:   Please see the history of present illness.     All other systems reviewed and are negative.  EKGs/Labs/Other Studies Reviewed:    The following studies were reviewed today:  EKG:   08/26/21 SR inferior MI  ECHO COMPLETE WO IMAGING ENHANCING AGENT 09/23/2021  Narrative ECHOCARDIOGRAM REPORT    Patient Name:   Alexandria Morrison Date of Exam: 09/23/2021 Medical Rec #:  [id number]      Height:       62.0 in Accession #:    [id number]     Weight:       245.0 lb Date of Birth:  [birth year]       BSA:          2.084 m Patient Age:    61 years       BP:           112/70 mmHg Patient Gender: F              HR:           91 bpm. Exam Location:  [facility]  Procedure: 2D Echo, Cardiac Doppler, Color Doppler, 3D Echo and Strain Analysis  Indications:    R60.0 Lower extremity edema  History:        Patient has no prior history of Echocardiogram examinations. Signs/Symptoms:Murmur; Risk Factors:Hypertension.  Sonographer:    Mikki Santee RDCS Referring Phys: [facility] A Satia Winger  IMPRESSIONS   1. Left ventricular ejection fraction, by estimation, is 60 to 65%. The left ventricle has normal function. The left ventricle has no regional wall motion abnormalities. There is mild left ventricular hypertrophy. Left ventricular diastolic parameters are consistent with Grade I diastolic dysfunction (impaired relaxation). 2. Right ventricular systolic function is normal. The right ventricular size is normal. Tricuspid regurgitation signal is inadequate for assessing PA pressure. 3. The mitral valve is grossly normal. Trivial mitral valve regurgitation. 4. The aortic valve is tricuspid. Aortic valve regurgitation is not visualized. 5. Aortic dilatation noted. There is borderline dilatation of the ascending aorta, measuring 39 mm. 6. The inferior vena cava is normal in size with <50% respiratory variability, suggesting right atrial pressure of 8 mmHg.  Comparison(s): No prior Echocardiogram.  FINDINGS Left Ventricle: Left ventricular ejection fraction, by estimation, is 60 to 65%. The left ventricle has normal function. The left ventricle has no regional wall motion abnormalities. 3D left ventricular ejection fraction analysis performed but not reported based on interpreter judgement due to suboptimal tracking. The left ventricular internal cavity size was normal in size. There is mild left ventricular hypertrophy. Left ventricular diastolic parameters are consistent with Grade I  diastolic dysfunction (impaired relaxation). Indeterminate filling pressures.  Right Ventricle: The right ventricular size is normal. No increase in right ventricular wall thickness. Right ventricular systolic function is normal. Tricuspid regurgitation signal is inadequate for assessing PA pressure.  Left Atrium: Left atrial size was normal in size.  Right Atrium: Right atrial size was normal in size.  Pericardium: There is no evidence of pericardial effusion.  Mitral Valve: The mitral valve is grossly normal. Trivial mitral valve regurgitation.  Tricuspid Valve:  The tricuspid valve is grossly normal. Tricuspid valve regurgitation is trivial.  Aortic Valve: The aortic valve is tricuspid. Aortic valve regurgitation is not visualized.  Pulmonic Valve: The pulmonic valve was normal in structure. Pulmonic valve regurgitation is not visualized.  Aorta: Aortic dilatation noted. There is borderline dilatation of the ascending aorta, measuring 39 mm.  Venous: The inferior vena cava is normal in size with less than 50% respiratory variability, suggesting right atrial pressure of 8 mmHg.  IAS/Shunts: No atrial level shunt detected by color flow Doppler.   LEFT VENTRICLE PLAX 2D LVIDd:         4.20 cm   Diastology LVIDs:         2.50 cm   LV e' medial:    11.30 cm/s LV PW:         1.10 cm   LV E/e' medial:  11.9 LV IVS:        1.10 cm   LV e' lateral:   10.30 cm/s LVOT diam:     1.90 cm   LV E/e' lateral: 13.0 LV SV:         73 LV SV Index:   35 LVOT Area:     2.84 cm  3D Volume EF: 3D EF:        54 % LV EDV:       121 ml LV ESV:       55 ml LV SV:        65 ml  RIGHT VENTRICLE RV Basal diam:  3.40 cm RV Mid diam:    3.00 cm RV S prime:     18.40 cm/s TAPSE (M-mode): 3.4 cm  LEFT ATRIUM           Index        RIGHT ATRIUM           Index LA diam:      3.70 cm 1.78 cm/m   RA Area:     15.40 cm LA Vol (A4C): 52.7 ml 25.29 ml/m  RA Volume:   38.80 ml  18.62 ml/m AORTIC  VALVE LVOT Vmax:   137.00 cm/s LVOT Vmean:  88.800 cm/s LVOT VTI:    0.259 m  AORTA Ao Root diam: 3.10 cm Ao Asc diam:  3.90 cm  MITRAL VALVE MV Area (PHT): 4.57 cm     SHUNTS MV Decel Time: 166 msec     Systemic VTI:  0.26 m MV E velocity: 134.00 cm/s  Systemic Diam: 1.90 cm MV A velocity: 147.00 cm/s MV E/A ratio:  0.91  Zoila Shutter MD Electronically signed by Zoila Shutter MD Signature Date/Time: 09/23/2021/12:15:25 PM    Final     Recent Labs: 01/29/2021: TSH 1.49 08/26/2021: ALT 37; Hemoglobin 11.2; Platelets 215.0 09/14/2021: Magnesium 2.3; NT-Pro BNP <36 09/28/2021: BUN 22; Creatinine, Ser 1.33; Potassium 3.9; Sodium 138  Recent Lipid Panel    Component Value Date/Time   CHOL 251 (H) 11/06/2020 1012   TRIG 102.0 11/06/2020 1012   HDL 76.30 11/06/2020 1012   CHOLHDL 3 11/06/2020 1012   VLDL 20.4 11/06/2020 1012   LDLCALC 154 (H) 11/06/2020 1012        Physical Exam:    VS:  BP 130/76   Pulse 83   Ht 5\' 2"  (1.575 m)   Wt 248 lb (112.5 kg)   SpO2 96%   BMI 45.36 kg/m     Wt Readings from Last 3 Encounters:  11/08/21 248 lb (112.5 kg)  10/06/21 244 lb  9.6 oz (110.9 kg)  09/06/21 245 lb (111.1 kg)    Gen: no distress, morbid obesity   Neck: No JVD, thick ness Cardiac: No Rubs or Gallops, holosystolic murmur RRR +2 radial pulses Respiratory: Clear to auscultation bilaterally, normal effort, normal  respiratory rate GI: Soft, nontender, non-distended  MS: +2 bilateral edema; moves all extremities Integument: Skin feels  Neuro:  At time of evaluation, alert and oriented to person/place/time/situation Psych: Normal affect, patient feels well  ASSESSMENT:    1. Chronic heart failure with preserved ejection fraction (HCC)   2. Morbid obesity (HCC)   3. Systolic murmur   4. 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  Concerns regarding medicines are outlined above.  Orders Placed This Encounter  Procedures   Basic metabolic panel   Pro b natriuretic peptide (BNP)   Meds ordered this encounter  Medications   empagliflozin (JARDIANCE) 10 MG TABS tablet    Sig: Take 1 tablet (10 mg total) by mouth daily before breakfast.    Dispense:  90 tablet    Refill:  3    Patient Instructions  Medication Instructions:  Your physician has recommended you make the following change in your medication:  START: empaglilozin (Jardiance) 10 mg by mouth once daily before breakfast Our office has provided you with a month worth of samples, a 14 day free trial offer, and a $10 co pay card  *If you need a refill on your cardiac medications before your next appointment, please call your pharmacy*   Lab Work: IN 1-2 WEEKS: BMP, BNP  If you have labs (blood work) drawn today and your tests are completely normal, you will receive your results only by: MyChart Message (if you have MyChart) OR A paper copy in the mail If you have any lab test that is abnormal or we need to change your treatment, we will call you to review the results.   Testing/Procedures: NONE   Follow-Up: At [facility], you and your health needs are our priority.  As part of our continuing mission to provide you with exceptional heart care, we have created designated Provider Care Teams.  These  Care Teams include your primary Cardiologist (physician) and Advanced Practice Providers (APPs -  Physician Assistants and Nurse Practitioners) who all work together to provide you with the care you need, when you need it.    Your next appointment:   3-4 month(s)  The format for your next appointment:   In Person  Provider:   Christell Constant, MD  or Ronie Spies, PA-C, Robin Searing, NP, or Jacolyn Reedy, PA-C       Important Information About Sugar         Signed, Christell Constant, MD  11/08/2021 5:00 PM    [facility]

## 2021-11-08 ENCOUNTER — Ambulatory Visit: Payer: BC Managed Care – PPO | Admitting: Internal Medicine

## 2021-11-08 ENCOUNTER — Encounter: Payer: Self-pay | Admitting: Internal Medicine

## 2021-11-08 ENCOUNTER — Ambulatory Visit: Payer: BC Managed Care – PPO | Attending: Internal Medicine | Admitting: Internal Medicine

## 2021-11-08 VITALS — BP 130/76 | HR 83 | Ht 62.0 in | Wt 248.0 lb

## 2021-11-08 DIAGNOSIS — R011 Cardiac murmur, unspecified: Secondary | ICD-10-CM

## 2021-11-08 DIAGNOSIS — I5032 Chronic diastolic (congestive) heart failure: Secondary | ICD-10-CM | POA: Diagnosis not present

## 2021-11-08 DIAGNOSIS — M25471 Effusion, right ankle: Secondary | ICD-10-CM | POA: Diagnosis not present

## 2021-11-08 DIAGNOSIS — M25472 Effusion, left ankle: Secondary | ICD-10-CM

## 2021-11-08 DIAGNOSIS — G5603 Carpal tunnel syndrome, bilateral upper limbs: Secondary | ICD-10-CM

## 2021-11-08 MED ORDER — EMPAGLIFLOZIN 10 MG PO TABS: 10.0000 mg | ORAL_TABLET | Freq: Every day | ORAL | 3 refills | Status: DC

## 2021-11-08 NOTE — Patient Instructions (Signed)
Medication Instructions:  Your physician has recommended you make the following change in your medication:  START: empaglilozin (Jardiance) 10 mg by mouth once daily before breakfast Our office has provided you with a month worth of samples, a 14 day free trial offer, and a $10 co pay card  *If you need a refill on your cardiac medications before your next appointment, please call your pharmacy*   Lab Work: IN 1-2 WEEKS: BMP, BNP  If you have labs (blood work) drawn today and your tests are completely normal, you will receive your results only by: [place] (if you have MyChart) OR A paper copy in the mail If you have any lab test that is abnormal or we need to change your treatment, we will call you to review the results.   Testing/Procedures: NONE   Follow-Up: At [facility], you and your health needs are our priority.  As part of our continuing mission to provide you with exceptional heart care, we have created designated Provider Care Teams.  These Care Teams include your primary Cardiologist (physician) and Advanced Practice Providers (APPs -  Physician Assistants and Nurse Practitioners) who all work together to provide you with the care you need, when you need it.    Your next appointment:   3-4 month(s)  The format for your next appointment:   In Person  Provider:   Werner Lean, MD  or Melina Copa, PA-C, Ambrose Pancoast, NP, or Ermalinda Barrios, PA-C       Important Information About Sugar

## 2021-11-09 ENCOUNTER — Ambulatory Visit: Payer: BC Managed Care – PPO | Admitting: Physician Assistant

## 2021-11-16 ENCOUNTER — Other Ambulatory Visit (HOSPITAL_COMMUNITY): Payer: Self-pay | Admitting: Nurse Practitioner

## 2021-11-16 ENCOUNTER — Other Ambulatory Visit: Payer: Self-pay | Admitting: Nurse Practitioner

## 2021-11-16 DIAGNOSIS — E8801 Alpha-1-antitrypsin deficiency: Secondary | ICD-10-CM

## 2021-11-18 ENCOUNTER — Encounter: Payer: Self-pay | Admitting: Physician Assistant

## 2021-11-18 ENCOUNTER — Ambulatory Visit: Payer: BC Managed Care – PPO | Admitting: Physician Assistant

## 2021-11-18 VITALS — BP 102/50 | HR 94 | Temp 98.4°F | Ht 62.0 in | Wt 244.4 lb

## 2021-11-18 DIAGNOSIS — I9589 Other hypotension: Secondary | ICD-10-CM

## 2021-11-18 DIAGNOSIS — I10 Essential (primary) hypertension: Secondary | ICD-10-CM | POA: Diagnosis not present

## 2021-11-18 DIAGNOSIS — F339 Major depressive disorder, recurrent, unspecified: Secondary | ICD-10-CM

## 2021-11-18 DIAGNOSIS — R600 Localized edema: Secondary | ICD-10-CM | POA: Diagnosis not present

## 2021-11-18 NOTE — Progress Notes (Signed)
Subjective:    Patient ID: Alexandria Morrison, female    DOB: [birth year], 61 y.o.   MRN: [id number]  Chief Complaint  Patient presents with   Follow-up    Pt is here to follow up about prozac    HPI Patient is in today for recheck. See A/P for details.    Past Medical History:  Diagnosis Date   Abnormal EKG    Cardiac murmur    CKD (chronic kidney disease)    Dyspnea    Elevated liver enzymes    HTN (hypertension)    Iron deficiency anemia    Lower extremity edema    Obesity     Past Surgical History:  Procedure Laterality Date   ABDOMINAL HYSTERECTOMY     TUBAL LIGATION      Family History  Problem Relation Age of Onset   Dementia Mother    Heart attack Father    Dementia Father    Hypertension Sister    Diabetes Brother    CVA Brother     Social History   Tobacco Use   Smoking status: Never   Smokeless tobacco: Never  Substance Use Topics   Alcohol use: No    Alcohol/week: 0.0 standard drinks of alcohol   Drug use: No     No Known Allergies  Review of Systems NEGATIVE UNLESS OTHERWISE INDICATED IN HPI      Objective:     BP (!) 102/50   Pulse 94   Temp 98.4 F (36.9 C) (Temporal)   Ht 5\' 2"  (1.575 m)   Wt 244 lb 6.4 oz (110.9 kg)   SpO2 98%   BMI 44.70 kg/m   Wt Readings from Last 3 Encounters:  11/18/21 244 lb 6.4 oz (110.9 kg)  11/08/21 248 lb (112.5 kg)  10/06/21 244 lb 9.6 oz (110.9 kg)    BP Readings from Last 3 Encounters:  11/18/21 (!) 102/50  11/08/21 130/76  10/06/21 118/76     Physical Exam Vitals and nursing note reviewed.  Constitutional:      Appearance: Normal appearance. She is obese. She is not toxic-appearing.  HENT:     Head: Normocephalic and atraumatic.  Eyes:     Extraocular Movements: Extraocular movements intact.     Conjunctiva/sclera: Conjunctivae normal.     Pupils: Pupils are equal, round, and reactive to light.  Cardiovascular:     Rate and Rhythm: Normal rate and regular rhythm.     Pulses:  Normal pulses.     Heart sounds: Murmur heard.     Systolic murmur is present with a grade of 2/6.  Pulmonary:     Effort: Pulmonary effort is normal.     Breath sounds: Normal breath sounds.  Musculoskeletal:        General: Normal range of motion.     Cervical back: Normal range of motion and neck supple.     Right lower leg: 2+ Edema present.     Left lower leg: 2+ Edema present.  Skin:    General: Skin is warm and dry.  Neurological:     General: No focal deficit present.     Mental Status: She is alert and oriented to person, place, and time.  Psychiatric:        Mood and Affect: Mood and affect normal. Mood is not anxious or depressed.        Assessment & Plan:  Depression, recurrent [facility]) Assessment & Plan: Patient seems to be doing much better.  She will continue on Prozac 20 mg once daily.  She has 3 new kittens and she is very excited about having them as well.   Primary hypertension Assessment & Plan: Patient's blood pressure was checked 3 times today by different nurses and myself, all readings found to be hypotensive.  She did state that she is feeling a lot more tired than normal and having occasional dizziness.  I messaged her cardiologist directly and it was suggested that she stop her losartan 25 mg at this time.  She is going to continue on Lasix and Jardiance.  Advised her to try to monitor her blood pressure at home.  Close follow-up.  She is to call sooner if any change in symptoms.   Bilateral lower extremity edema Assessment & Plan: Patient continues to work with cardiology on this as well.  She will continue on Jardiance 10 mg once daily.  She will also continue on Lasix 60 mg 3 times daily.  Encouraged her to try to walk and wear compression stockings.  She takes gabapentin 300 mg at bedtime to help with the pain.   Other specified hypotension      Return in about 1 month (around 12/21/2021) for recheck BP with Yukari Flax .  This note was prepared  with assistance of Systems analyst. Occasional wrong-word or sound-a-like substitutions may have occurred due to the inherent limitations of voice recognition software.  Time Spent: 39 minutes of total time was spent on the date of the encounter performing the following actions: chart review prior to seeing the patient, obtaining history, performing a medically necessary exam, counseling on the treatment plan, placing orders, and documenting in our EHR.       Danitza Schoenfeldt M Sheera Illingworth, PA-C

## 2021-11-18 NOTE — Patient Instructions (Addendum)
Great to see you doing so well!   Let's continue on Prozac 20 mg daily; I think it's helping depression symptoms.   STOP taking Losartan. Continue on Lasix and Jardiance for now.

## 2021-11-22 DIAGNOSIS — I10 Essential (primary) hypertension: Secondary | ICD-10-CM | POA: Insufficient documentation

## 2021-11-22 DIAGNOSIS — F339 Major depressive disorder, recurrent, unspecified: Secondary | ICD-10-CM | POA: Insufficient documentation

## 2021-11-22 NOTE — Assessment & Plan Note (Signed)
Patient's blood pressure was checked 3 times today by different nurses and myself, all readings found to be hypotensive.  She did state that she is feeling a lot more tired than normal and having occasional dizziness.  I messaged her cardiologist directly and it was suggested that she stop her losartan 25 mg at this time.  She is going to continue on Lasix and Jardiance.  Advised her to try to monitor her blood pressure at home.  Close follow-up.  She is to call sooner if any change in symptoms.

## 2021-11-22 NOTE — Assessment & Plan Note (Signed)
Patient continues to work with cardiology on this as well.  She will continue on Jardiance 10 mg once daily.  She will also continue on Lasix 60 mg 3 times daily.  Encouraged her to try to walk and wear compression stockings.  She takes gabapentin 300 mg at bedtime to help with the pain.

## 2021-11-22 NOTE — Assessment & Plan Note (Signed)
Patient seems to be doing much better.  She will continue on Prozac 20 mg once daily.  She has 3 new kittens and she is very excited about having them as well.

## 2021-11-23 ENCOUNTER — Ambulatory Visit: Payer: BC Managed Care – PPO | Attending: Internal Medicine

## 2021-11-23 DIAGNOSIS — I5032 Chronic diastolic (congestive) heart failure: Secondary | ICD-10-CM

## 2021-11-24 LAB — BASIC METABOLIC PANEL
BUN/Creatinine Ratio: 15 (ref 12–28)
BUN: 17 mg/dL (ref 8–27)
CO2: 25 mmol/L (ref 20–29)
Calcium: 9.2 mg/dL (ref 8.7–10.3)
Chloride: 101 mmol/L (ref 96–106)
Creatinine, Ser: 1.15 mg/dL — ABNORMAL HIGH (ref 0.57–1.00)
Glucose: 112 mg/dL — ABNORMAL HIGH (ref 70–99)
Potassium: 4 mmol/L (ref 3.5–5.2)
Sodium: 140 mmol/L (ref 134–144)
eGFR: 54 mL/min/{1.73_m2} — ABNORMAL LOW (ref >59)

## 2021-11-24 LAB — PRO B NATRIURETIC PEPTIDE: NT-Pro BNP: <36 pg/mL (ref 0–287)

## 2021-12-10 ENCOUNTER — Encounter: Payer: Self-pay | Admitting: Internal Medicine

## 2021-12-10 ENCOUNTER — Ambulatory Visit: Payer: BC Managed Care – PPO | Admitting: Internal Medicine

## 2021-12-10 VITALS — BP 140/76 | HR 71 | Ht 62.0 in | Wt 243.2 lb

## 2021-12-10 DIAGNOSIS — D509 Iron deficiency anemia, unspecified: Secondary | ICD-10-CM | POA: Diagnosis not present

## 2021-12-10 DIAGNOSIS — R7989 Other specified abnormal findings of blood chemistry: Secondary | ICD-10-CM

## 2021-12-10 DIAGNOSIS — R110 Nausea: Secondary | ICD-10-CM | POA: Diagnosis not present

## 2021-12-10 DIAGNOSIS — R195 Other fecal abnormalities: Secondary | ICD-10-CM

## 2021-12-10 DIAGNOSIS — D649 Anemia, unspecified: Secondary | ICD-10-CM | POA: Diagnosis not present

## 2021-12-10 MED ORDER — NA SULFATE-K SULFATE-MG SULF 17.5-3.13-1.6 GM/177ML PO SOLN: ORAL | 0 refills | Status: AC

## 2021-12-10 NOTE — Progress Notes (Signed)
Chief Complaint: Anemia, positive FOBT  HPI : 61 year old female with history of CKD, IDA, obesity, HFpEF, and elevated LFTs presents with anemia and positive FOBT  Denies blood in stools. She had a positive FOBT last month. Denies prior colonoscopy. Denies diarrhea, constipation, abdominal pain, weight loss. Sometimes she will feel nauseous. Denies vomiting. Denies dysphagia, odynophagia, chest burning, or regurgitation. She takes Advil every 2 weeks. Denies use of blood thinners. She is on iron supplements for iron deficiency. Denies menstrual periods. Denies prior EGD. Denies family history of GI cancers. Denies alcohol use. She follows with [facility] for hepatology care and is currently undergoing work up for elevated LFTs  Past Medical History:  Diagnosis Date   Abnormal EKG    Cardiac murmur    CKD (chronic kidney disease)    Depression    Dyspnea    Elevated liver enzymes    HTN (hypertension)    Iron deficiency anemia    Lower extremity edema    Obesity    Past Surgical History:  Procedure Laterality Date   ABDOMINAL HYSTERECTOMY     TUBAL LIGATION     Family History  Problem Relation Age of Onset   Dementia Mother    Heart attack Father    Dementia Father    Hypertension Sister    Diabetes Brother    CVA Brother    Colon cancer Neg Hx    Stomach cancer Neg Hx    Esophageal cancer Neg Hx    Colon polyps Neg Hx    Social History   Tobacco Use   Smoking status: Never   Smokeless tobacco: Never  Vaping Use   Vaping Use: Never used  Substance Use Topics   Alcohol use: No    Alcohol/week: 0.0 standard drinks of alcohol   Drug use: No   Current Outpatient Medications  Medication Sig Dispense Refill   cholecalciferol (VITAMIN D3) 25 MCG (1000 UNIT) tablet Take 1,000 Units by mouth daily.     empagliflozin (JARDIANCE) 10 MG TABS tablet Take 1 tablet (10 mg total) by mouth daily before breakfast. 90 tablet 3   ferrous sulfate 325 (65 FE) MG tablet Take 1  tablet (325 mg total) by mouth every other day. Take with Vit C. 30 tablet 3   furosemide (LASIX) 20 MG tablet Take 3 tablets (60 mg total) by mouth daily. 252 tablet 3   gabapentin (NEURONTIN) 300 MG capsule Take 1 capsule (300 mg total) by mouth at bedtime. 30 capsule 3   magnesium 30 MG tablet Take 30 mg by mouth 2 (two) times daily.     potassium chloride SA (KLOR-CON M) 20 MEQ tablet Take 2 tablets (40 mEq total) by mouth daily. 180 tablet 3   vitamin B-12 (CYANOCOBALAMIN) 1000 MCG tablet Take 1,000 mcg by mouth daily.     Vitamin D, Ergocalciferol, (DRISDOL) 1.25 MG (50000 UNIT) CAPS capsule Take 1 capsule (50,000 Units total) by mouth every 7 (seven) days. 12 capsule 0   FLUoxetine (PROZAC) 20 MG capsule Take one capsule po prior to your shift at work. (Patient not taking: Reported on 12/10/2021) 30 capsule 0   losartan (COZAAR) 25 MG tablet Take 1 tablet by mouth once daily (Patient not taking: Reported on 12/10/2021) 30 tablet 0   No current facility-administered medications for this visit.   No Known Allergies  Review of Systems: All systems reviewed and negative except where noted in HPI.   Physical Exam: BP (!) 140/76  Pulse 71   Ht 5' 2" (1.575 m)   Wt 243 lb 3.2 oz (110.3 kg)   SpO2 98%   BMI 44.48 kg/m  Constitutional: Pleasant,well-developed, female in no acute distress. HEENT: Normocephalic and atraumatic. Conjunctivae are normal. No scleral icterus. Cardiovascular: Normal rate, regular rhythm.  Pulmonary/chest: Effort normal and breath sounds normal. No wheezing, rales or rhonchi. Abdominal: Soft, nondistended, nontender. Bowel sounds active throughout. There are no masses palpable. No hepatomegaly. Extremities: No edema Neurological: Alert and oriented to person place and time. Skin: Skin is warm and dry. No rashes noted. Psychiatric: Normal mood and affect. Behavior is normal.  Labs 08/2021: CBC with low Hb of 11.2. Plts nml.   Labs 10/2021: BMP with elevated  Cr of 1.15. A1AT low at 87. Anti-LKM negative. AMA negative. GGT elevated at 286. ANA negative. Ceruloplasmin nml. IgG nml. LFTs with elevated alk phos of 171. Hep A virus negative. HbSAg negative. HIV negative. FOBT positive. Ferritin nml at 85.   RUQ U/S 03/02/21: IMPRESSION: 1. Noted within the gallbladder is a stable appearing approximately 8 mm echogenic focus consistent with tiny gallstone or polyp. No interim change from prior ultrasound 08/16/2017. No evidence of cholecystitis or biliary distention. 2. Heterogeneous hepatic parenchymal pattern consistent fatty infiltration and or hepatocellular disease. No focal hepatic abnormality identified.  MR Abd w/contrast 08/29/21: IMPRESSION: 1. Nodule within or closely abutting the tip of the pancreatic tail measuring 1.8 x 1.6 cm. This isosignal and isoenhancing to spleen on all sequences consistent with a benign accessory splenule. No further follow-up or characterization is required. 2. Mild hepatomegaly. 3. Status post hysterectomy.  TTE 09/23/21: IMPRESSIONS  1. Left ventricular ejection fraction, by estimation, is 60 to 65%. The  left ventricle has normal function. The left ventricle has no regional  wall motion abnormalities. There is mild left ventricular hypertrophy.  Left ventricular diastolic parameters  are consistent with Grade I diastolic dysfunction (impaired relaxation).   2. Right ventricular systolic function is normal. The right ventricular  size is normal. Tricuspid regurgitation signal is inadequate for assessing  PA pressure.   3. The mitral valve is grossly normal. Trivial mitral valve  regurgitation.   4. The aortic valve is tricuspid. Aortic valve regurgitation is not  visualized.   5. Aortic dilatation noted. There is borderline dilatation of the  ascending aorta, measuring 39 mm.   6. The inferior vena cava is normal in size with <50% respiratory  variability, suggesting right atrial pressure of 8 mmHg.    ASSESSMENT AND PLAN: Positive FOBT Anemia Iron deficiency Nausea Elevated LFTs Patient presents with positive FOBT and IDA for which she is currently on oral iron supplements. Will plan to look for GI sources of blood loss by performing EGD and colonoscopy. I went over these procedures in detail with the patient, and she is agreeable to proceeding. Patient is also currently getting a work up for underlying liver disease with [facility] due to elevated LFTs. - EGD/colonoscopy [facility] - Will defer further liver work up to [employer], MD  I spent 47 minutes of time, including in depth chart review, independent review of results as outlined above, communicating results with the patient directly, face-to-face time with the patient, coordinating care, ordering studies and medications as appropriate, and documentation.

## 2021-12-10 NOTE — Patient Instructions (Addendum)
  If you are age 61 or older, your body mass index should be between 23-30. Your Body mass index is 44.48 kg/m. If this is out of the aforementioned range listed, please consider follow up with your Primary Care Provider.  If you are age 79 or younger, your body mass index should be between 19-25. Your Body mass index is 44.48 kg/m. If this is out of the aformentioned range listed, please consider follow up with your Primary Care Provider.   ________________________________________________________  The [facility] providers would like to encourage you to use [facility] to communicate with providers for non-urgent requests or questions.  Due to long hold times on the telephone, sending your provider a message by [facility] may be a faster and more efficient way to get a response.  Please allow 48 business hours for a response.  Please remember that this is for non-urgent requests.   You have been scheduled for an endoscopy and colonoscopy. Please follow the written instructions given to you at your visit today. Please pick up your prep supplies at the pharmacy within the next 1-3 days. If you use inhalers (even only as needed), please bring them with you on the day of your procedure.   Due to recent changes in healthcare laws, you may see the results of your imaging and laboratory studies on MyChart before your provider has had a chance to review them.  We understand that in some cases there may be results that are confusing or concerning to you. Not all laboratory results come back in the same time frame and the provider may be waiting for multiple results in order to interpret others.  Please give [place] 48 hours in order for your provider to thoroughly review all the results before contacting the office for clarification of your results.    Thank you for entrusting me with your care and choosing [facility].  Dr. Leonides Schanz

## 2021-12-21 ENCOUNTER — Other Ambulatory Visit (HOSPITAL_COMMUNITY): Payer: Self-pay | Admitting: Nurse Practitioner

## 2021-12-21 DIAGNOSIS — Z148 Genetic carrier of other disease: Secondary | ICD-10-CM | POA: Diagnosis not present

## 2021-12-21 DIAGNOSIS — K760 Fatty (change of) liver, not elsewhere classified: Secondary | ICD-10-CM | POA: Diagnosis not present

## 2021-12-21 DIAGNOSIS — E8801 Alpha-1-antitrypsin deficiency: Secondary | ICD-10-CM

## 2021-12-21 DIAGNOSIS — R7401 Elevation of levels of liver transaminase levels: Secondary | ICD-10-CM | POA: Diagnosis not present

## 2021-12-24 DIAGNOSIS — K760 Fatty (change of) liver, not elsewhere classified: Secondary | ICD-10-CM | POA: Insufficient documentation

## 2021-12-24 DIAGNOSIS — Z148 Genetic carrier of other disease: Secondary | ICD-10-CM | POA: Insufficient documentation

## 2021-12-24 DIAGNOSIS — R7401 Elevation of levels of liver transaminase levels: Secondary | ICD-10-CM | POA: Insufficient documentation

## 2021-12-30 ENCOUNTER — Encounter: Payer: Self-pay | Admitting: Physician Assistant

## 2021-12-30 ENCOUNTER — Ambulatory Visit: Payer: BC Managed Care – PPO | Admitting: Physician Assistant

## 2021-12-30 VITALS — BP 118/82 | HR 92 | Temp 97.8°F | Ht 62.0 in | Wt 246.2 lb

## 2021-12-30 DIAGNOSIS — R7303 Prediabetes: Secondary | ICD-10-CM | POA: Insufficient documentation

## 2021-12-30 DIAGNOSIS — I10 Essential (primary) hypertension: Secondary | ICD-10-CM | POA: Diagnosis not present

## 2021-12-30 LAB — POCT GLYCOSYLATED HEMOGLOBIN (HGB A1C): Hemoglobin A1C: 5.7 % — AB (ref 4.0–5.6)

## 2021-12-30 NOTE — Assessment & Plan Note (Signed)
BP is normotensive, no longer having hypotensive readings. Will continue to monitor. Continue on Lasix 60 mg and Jardiance 10 mg.

## 2021-12-30 NOTE — Progress Notes (Signed)
Subjective:    Patient ID: Alexandria Morrison, female    DOB: Dec 16, 1960, 61 y.o.   MRN: [id number]  Chief Complaint  Patient presents with   Follow-up    Pt has no questions or concerns    Hypertension    Hypertension   Patient is in today for recheck on BP. Stopped Losartan 25 mg since last visit due to fatigue and low BP readings. BP readings been doing well. No problems. Currently taking Jardiance 10 mg and Lasix 60 mg.   Past Medical History:  Diagnosis Date   Abnormal EKG    Cardiac murmur    CKD (chronic kidney disease)    Depression    Dyspnea    Elevated liver enzymes    HTN (hypertension)    Iron deficiency anemia    Lower extremity edema    Obesity     Past Surgical History:  Procedure Laterality Date   ABDOMINAL HYSTERECTOMY     TUBAL LIGATION      Family History  Problem Relation Age of Onset   Dementia Mother    Heart attack Father    Dementia Father    Hypertension Sister    Diabetes Brother    CVA Brother    Colon cancer Neg Hx    Stomach cancer Neg Hx    Esophageal cancer Neg Hx    Colon polyps Neg Hx     Social History   Tobacco Use   Smoking status: Never   Smokeless tobacco: Never  Vaping Use   Vaping Use: Never used  Substance Use Topics   Alcohol use: No    Alcohol/week: 0.0 standard drinks of alcohol   Drug use: No     No Known Allergies  Review of Systems NEGATIVE UNLESS OTHERWISE INDICATED IN HPI      Objective:     BP 118/82 (BP Location: Left Arm, Patient Position: Sitting)   Pulse 92   Temp 97.8 F (36.6 C) (Temporal)   Ht 5\' 2"  (1.575 m)   Wt 246 lb 3.2 oz (111.7 kg)   SpO2 95%   BMI 45.03 kg/m   Wt Readings from Last 3 Encounters:  12/30/21 246 lb 3.2 oz (111.7 kg)  12/10/21 243 lb 3.2 oz (110.3 kg)  11/18/21 244 lb 6.4 oz (110.9 kg)    BP Readings from Last 3 Encounters:  12/30/21 118/82  12/10/21 (!) 140/76  11/18/21 (!) 102/50     Physical Exam Vitals and nursing note reviewed.   Constitutional:      Appearance: Normal appearance. She is obese.  Cardiovascular:     Rate and Rhythm: Normal rate and regular rhythm.     Pulses: Normal pulses.  Pulmonary:     Effort: Pulmonary effort is normal.     Breath sounds: Normal breath sounds.  Neurological:     Mental Status: She is alert.  Psychiatric:        Mood and Affect: Mood normal.        Assessment & Plan:  Primary hypertension Assessment & Plan: BP is normotensive, no longer having hypotensive readings. Will continue to monitor. Continue on Lasix 60 mg and Jardiance 10 mg.    Prediabetes Assessment & Plan: Lab Results  Component Value Date   HGBA1C 5.7 (A) 12/30/2021   Great work lowering A1c. Cont Jardiance 10 mg. Monitor sweets / cont work on lifestyle.   Recheck in 6 months   Orders: -     POCT glycosylated hemoglobin (Hb A1C)  Return in about 6 months (around 07/01/2022) for recheck .     Jakala Herford M Xyon Lukasik, PA-C

## 2021-12-30 NOTE — Patient Instructions (Signed)
Hemoglobin A1c is 5.7% - great work! Continue current medication regimen  Call if concerns

## 2021-12-30 NOTE — Assessment & Plan Note (Signed)
Lab Results  Component Value Date   HGBA1C 5.7 (A) 12/30/2021   Great work lowering A1c. Cont Jardiance 10 mg. Monitor sweets / cont work on lifestyle.   Recheck in 6 months

## 2022-01-06 ENCOUNTER — Encounter: Payer: Self-pay | Admitting: Physician Assistant

## 2022-01-20 ENCOUNTER — Other Ambulatory Visit: Payer: Self-pay | Admitting: Physician Assistant

## 2022-02-14 ENCOUNTER — Ambulatory Visit: Payer: BC Managed Care – PPO | Admitting: Internal Medicine

## 2022-02-15 ENCOUNTER — Other Ambulatory Visit: Payer: Self-pay | Admitting: Physician Assistant

## 2022-02-16 ENCOUNTER — Other Ambulatory Visit: Payer: Self-pay | Admitting: Physician Assistant

## 2022-02-16 DIAGNOSIS — Z1231 Encounter for screening mammogram for malignant neoplasm of breast: Secondary | ICD-10-CM

## 2022-02-17 ENCOUNTER — Encounter: Payer: Self-pay | Admitting: Internal Medicine

## 2022-02-21 ENCOUNTER — Ambulatory Visit: Payer: BC Managed Care – PPO | Admitting: Internal Medicine

## 2022-02-23 ENCOUNTER — Other Ambulatory Visit: Payer: Self-pay | Admitting: Radiology

## 2022-02-23 DIAGNOSIS — E8801 Alpha-1-antitrypsin deficiency: Secondary | ICD-10-CM

## 2022-02-24 ENCOUNTER — Ambulatory Visit: Payer: BC Managed Care – PPO | Attending: Internal Medicine | Admitting: Internal Medicine

## 2022-02-24 ENCOUNTER — Encounter: Payer: Self-pay | Admitting: Internal Medicine

## 2022-02-24 VITALS — BP 122/72 | HR 76 | Ht 62.0 in | Wt 251.0 lb

## 2022-02-24 DIAGNOSIS — I5032 Chronic diastolic (congestive) heart failure: Secondary | ICD-10-CM

## 2022-02-24 DIAGNOSIS — E782 Mixed hyperlipidemia: Secondary | ICD-10-CM | POA: Insufficient documentation

## 2022-02-24 DIAGNOSIS — I10 Essential (primary) hypertension: Secondary | ICD-10-CM

## 2022-02-24 MED ORDER — SPIRONOLACTONE 25 MG PO TABS: 12.5000 mg | ORAL_TABLET | Freq: Every day | ORAL | 3 refills | Status: DC

## 2022-02-24 NOTE — Progress Notes (Signed)
Cardiology Office Note:    Date:  02/24/2022   ID:  Alexandria Morrison, DOB 04/24/1960, MRN [id number]  PCP:  Alexandria Morrison, Alexandria Infante, PA-C   [facility] Providers Cardiologist:  Alexandria Constant, MD     Referring MD: Alexandria Leriche, PA-C   CC: HFpEF f/u  History of Present Illness:    Alexandria Morrison is Alexandria 62 y.o. female with Alexandria hx of HTN, Morbid Obesity, LE edema, and IDA who presents for evaluation.  2023: started on increased diuretic.  Negative LE duplex.  Started SGLT2i  Patient notes that she is doing well.   Since last visit notes BP improved on SGLT2i; she ended up coming down of losartan. There are no interval hospital/ED visit.    No chest pain or pressure .  No SOB/DOE and no PND/Orthopnea.  Leg swelling is ok as long as she used compression stockings.  No palpitations or syncope .  Ambulatory blood pressure not done.   Past Medical History:  Diagnosis Date   Abnormal EKG    Cardiac murmur    CKD (chronic kidney disease)    Depression    Dyspnea    Elevated liver enzymes    HTN (hypertension)    Iron deficiency anemia    Lower extremity edema    Obesity     Past Surgical History:  Procedure Laterality Date   ABDOMINAL HYSTERECTOMY     TUBAL LIGATION      Current Medications: Current Meds  Medication Sig   ascorbic acid (VITAMIN C) 1000 MG tablet Take 1,000 mg by mouth daily.   cholecalciferol (VITAMIN D3) 25 MCG (1000 UNIT) tablet Take 1,000 Units by mouth daily.   empagliflozin (JARDIANCE) 10 MG TABS tablet Take 1 tablet (10 mg total) by mouth daily before breakfast.   ferrous sulfate 325 (65 FE) MG tablet Take 1 tablet (325 mg total) by mouth every other day. Take with Vit C.   furosemide (LASIX) 20 MG tablet Take 3 tablets (60 mg total) by mouth daily.   gabapentin (NEURONTIN) 300 MG capsule Take 1 capsule by mouth at bedtime   magnesium 30 MG tablet Take 30 mg by mouth 2 (two) times daily.   Na Sulfate-K Sulfate-Mg Sulf 17.5-3.13-1.6  GM/177ML SOLN Use as directed; may use generic; goodrx card if insurance will not cover generic   potassium chloride SA (KLOR-CON M) 20 MEQ tablet Take 2 tablets (40 mEq total) by mouth daily.   spironolactone (ALDACTONE) 25 MG tablet Take 0.5 tablets (12.5 mg total) by mouth daily.   vitamin B-12 (CYANOCOBALAMIN) 1000 MCG tablet Take 1,000 mcg by mouth daily.     Allergies:   Patient has no known allergies.   Social History   Socioeconomic History   Marital status: Single    Spouse name: Not on file   Number of children: Not on file   Years of education: Not on file   Highest education level: Not on file  Occupational History   Not on file  Tobacco Use   Smoking status: Never   Smokeless tobacco: Never  Vaping Use   Vaping Use: Never used  Substance and Sexual Activity   Alcohol use: No    Alcohol/week: 0.0 standard drinks of alcohol   Drug use: No   Sexual activity: Not on file  Other Topics Concern   Not on file  Social History Narrative   Not on file   Social Determinants of Health   Financial Resource Strain: Not  on file  Food Insecurity: Not on file  Transportation Needs: Not on file  Physical Activity: Not on file  Stress: Not on file  Social Connections: Not on file     Family History: The patient's family history includes CVA in her brother; Dementia in her father and mother; Diabetes in her brother; Heart attack in her father; Hypertension in her sister. There is no history of Colon cancer, Stomach cancer, Esophageal cancer, or Colon polyps. Brother died of heart disease at age of 8; unclear cardiac etiology  ROS:   Please see the history of present illness.     All other systems reviewed and are negative.  EKGs/Labs/Other Studies Reviewed:    The following studies were reviewed today:  EKG:   08/26/21 SR inferior MI  Cardiac Studies & Procedures       ECHOCARDIOGRAM  ECHOCARDIOGRAM COMPLETE 09/23/2021  Narrative ECHOCARDIOGRAM  REPORT    Patient Name:   Alexandria Morrison Date of Exam: 09/23/2021 Medical Rec #:  [id number]      Height:       62.0 in Accession #:    [id number]     Weight:       245.0 lb Date of Birth:  [birth year]      BSA:          2.084 m Patient Age:    81 years       BP:           112/70 mmHg Patient Gender: F              HR:           91 bpm. Exam Location:  [facility]  Procedure: 2D Echo, Cardiac Doppler, Color Doppler, 3D Echo and Strain Analysis  Indications:    R60.0 Lower extremity edema  History:        Patient has no prior history of Echocardiogram examinations. Signs/Symptoms:Murmur; Risk Factors:Hypertension.  Sonographer:    Alexandria Morrison RDCS Referring Phys: Alexandria Morrison  IMPRESSIONS   1. Left ventricular ejection fraction, by estimation, is 60 to 65%. The left ventricle has normal function. The left ventricle has no regional wall motion abnormalities. There is mild left ventricular hypertrophy. Left ventricular diastolic parameters are consistent with Grade I diastolic dysfunction (impaired relaxation). 2. Right ventricular systolic function is normal. The right ventricular size is normal. Tricuspid regurgitation signal is inadequate for assessing PA pressure. 3. The mitral valve is grossly normal. Trivial mitral valve regurgitation. 4. The aortic valve is tricuspid. Aortic valve regurgitation is not visualized. 5. Aortic dilatation noted. There is borderline dilatation of the ascending aorta, measuring 39 mm. 6. The inferior vena cava is normal in size with <50% respiratory variability, suggesting right atrial pressure of 8 mmHg.  Comparison(s): No prior Echocardiogram.  FINDINGS Left Ventricle: Left ventricular ejection fraction, by estimation, is 60 to 65%. The left ventricle has normal function. The left ventricle has no regional wall motion abnormalities. 3D left ventricular ejection fraction analysis performed but not reported based on interpreter  judgement due to suboptimal tracking. The left ventricular internal cavity size was normal in size. There is mild left ventricular hypertrophy. Left ventricular diastolic parameters are consistent with Grade I diastolic dysfunction (impaired relaxation). Indeterminate filling pressures.  Right Ventricle: The right ventricular size is normal. No increase in right ventricular wall thickness. Right ventricular systolic function is normal. Tricuspid regurgitation signal is inadequate for assessing PA pressure.  Left Atrium: Left atrial size was normal in size.  Right Atrium: Right atrial size was normal in size.  Pericardium: There is no evidence of pericardial effusion.  Mitral Valve: The mitral valve is grossly normal. Trivial mitral valve regurgitation.  Tricuspid Valve: The tricuspid valve is grossly normal. Tricuspid valve regurgitation is trivial.  Aortic Valve: The aortic valve is tricuspid. Aortic valve regurgitation is not visualized.  Pulmonic Valve: The pulmonic valve was normal in structure. Pulmonic valve regurgitation is not visualized.  Aorta: Aortic dilatation noted. There is borderline dilatation of the ascending aorta, measuring 39 mm.  Venous: The inferior vena cava is normal in size with less than 50% respiratory variability, suggesting right atrial pressure of 8 mmHg.  IAS/Shunts: No atrial level shunt detected by color flow Doppler.   LEFT VENTRICLE PLAX 2D LVIDd:         4.20 cm   Diastology LVIDs:         2.50 cm   LV e' medial:    11.30 cm/s LV PW:         1.10 cm   LV E/e' medial:  11.9 LV IVS:        1.10 cm   LV e' lateral:   10.30 cm/s LVOT diam:     1.90 cm   LV E/e' lateral: 13.0 LV SV:         73 LV SV Index:   35 LVOT Area:     2.84 cm  3D Volume EF: 3D EF:        54 % LV EDV:       121 ml LV ESV:       55 ml LV SV:        65 ml  RIGHT VENTRICLE RV Basal diam:  3.40 cm RV Mid diam:    3.00 cm RV S prime:     18.40 cm/s TAPSE (M-mode): 3.4  cm  LEFT ATRIUM           Index        RIGHT ATRIUM           Index LA diam:      3.70 cm 1.78 cm/m   RA Area:     15.40 cm LA Vol (A4C): 52.7 ml 25.29 ml/m  RA Volume:   38.80 ml  18.62 ml/m AORTIC VALVE LVOT Vmax:   137.00 cm/s LVOT Vmean:  88.800 cm/s LVOT VTI:    0.259 m  AORTA Ao Root diam: 3.10 cm Ao Asc diam:  3.90 cm  MITRAL VALVE MV Area (PHT): 4.57 cm     SHUNTS MV Decel Time: 166 msec     Systemic VTI:  0.26 m MV E velocity: 134.00 cm/s  Systemic Diam: 1.90 cm MV Alexandria velocity: 147.00 cm/s MV E/Alexandria ratio:  0.91  Lyman Bishop MD Electronically signed by Lyman Bishop MD Signature Date/Time: 09/23/2021/12:15:25 PM    Final             Recent Labs: 08/26/2021: ALT 37; Hemoglobin 11.2; Platelets 215.0 09/14/2021: Magnesium 2.3 11/23/2021: BUN 17; Creatinine, Ser 1.15; NT-Pro BNP <36; Potassium 4.0; Sodium 140  Recent Lipid Panel    Component Value Date/Time   CHOL 251 (H) 11/06/2020 1012   TRIG 102.0 11/06/2020 1012   HDL 76.30 11/06/2020 1012   CHOLHDL 3 11/06/2020 1012   VLDL 20.4 11/06/2020 1012   LDLCALC 154 (H) 11/06/2020 1012        Physical Exam:    VS:  BP 122/72   Pulse 76   Ht 5'  2" (1.575 m)   Wt 251 lb (113.9 kg)   SpO2 98%   BMI 45.91 kg/m     Wt Readings from Last 3 Encounters:  02/24/22 251 lb (113.9 kg)  12/30/21 246 lb 3.2 oz (111.7 kg)  12/10/21 243 lb 3.2 oz (110.3 kg)    Gen: no distress, morbid obesity   Neck: No JVD Cardiac: No Rubs or Gallops, RRR +2 radial pulses Respiratory: Clear to auscultation bilaterally, normal effort, normal  respiratory rate GI: Soft, nontender, non-distended  MS: bilateral non pitting edema; moves all extremities Integument: Skin feels warm Neuro:  At time of evaluation, alert and oriented to person/place/time/situation Psych: Normal affect, patient feels well  ASSESSMENT:    1. Chronic heart failure with preserved ejection fraction ([place])   2. Morbid obesity ([place])   3. Primary  hypertension   4. 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  Concerns regarding medicines are outlined above.  Orders Placed This Encounter  Procedures   Basic metabolic panel   Meds ordered this encounter  Medications   spironolactone (ALDACTONE) 25 MG tablet    Sig: Take 0.5 tablets (12.5 mg total) by mouth daily.    Dispense:  45 tablet    Refill:  3    Patient Instructions  Medication Instructions:  Your physician has recommended you make the following change in your medication:  START: spironolactone (Aldactone) 12.5 mg by mouth once daily  *If you need [place] refill on your cardiac medications before your next appointment, please call your pharmacy*   Lab Work: IN 1-2 WEEKS: BMP  If you have labs (blood work) drawn today and your tests are completely normal, you will receive your results only by: [place] (if you have MyChart) OR [place] paper copy in the mail If you have any lab test that is abnormal or we need to change your treatment, we will call you to review the results.   Testing/Procedures: Please monitor your Blood Pressure.  We have provided you with [place] Blood pressure cuff.    Follow-Up: At [facility], you and your health needs are our priority.  As part of our continuing mission to provide you with exceptional heart care, we have created designated Provider Care  Teams.  These Care Teams include your primary Cardiologist (physician) and Advanced Practice Providers (APPs -  Physician Assistants and Nurse Practitioners) who all work together to provide you with the care you need, when you need it.   Your next appointment:   3 month(s)  Provider:   Werner Lean, MD       Signed, Werner Lean, MD  02/24/2022 12:56 PM    [place]

## 2022-02-24 NOTE — Patient Instructions (Addendum)
Medication Instructions:  Your physician has recommended you make the following change in your medication:  START: spironolactone (Aldactone) 12.5 mg by mouth once daily  *If you need a refill on your cardiac medications before your next appointment, please call your pharmacy*   Lab Work: IN 1-2 WEEKS: BMP, FLP (nothing to eat or drink 8-12 hours before except water and black coffee) - - called pt advised of need for FLP at 2/14 lab appointment  If you have labs (blood work) drawn today and your tests are completely normal, you will receive your results only by: [place] (if you have MyChart) OR A paper copy in the mail If you have any lab test that is abnormal or we need to change your treatment, we will call you to review the results.   Testing/Procedures: Please monitor your Blood Pressure.  We have provided you with a Blood pressure cuff.    Follow-Up: At [facility], you and your health needs are our priority.  As part of our continuing mission to provide you with exceptional heart care, we have created designated Provider Care Teams.  These Care Teams include your primary Cardiologist (physician) and Advanced Practice Providers (APPs -  Physician Assistants and Nurse Practitioners) who all work together to provide you with the care you need, when you need it.   Your next appointment:   3 month(s)  Provider:   Werner Lean, MD

## 2022-02-24 NOTE — Addendum Note (Signed)
Addended by: Precious Gilding on: 02/24/2022 01:12 PM   Modules accepted: Orders

## 2022-02-25 ENCOUNTER — Encounter: Payer: BC Managed Care – PPO | Admitting: Internal Medicine

## 2022-02-28 ENCOUNTER — Ambulatory Visit (HOSPITAL_COMMUNITY): Payer: BC Managed Care – PPO

## 2022-02-28 ENCOUNTER — Encounter (HOSPITAL_COMMUNITY): Payer: Self-pay

## 2022-03-09 ENCOUNTER — Ambulatory Visit: Payer: BC Managed Care – PPO | Attending: Internal Medicine

## 2022-03-09 DIAGNOSIS — I5032 Chronic diastolic (congestive) heart failure: Secondary | ICD-10-CM | POA: Diagnosis not present

## 2022-03-09 DIAGNOSIS — E782 Mixed hyperlipidemia: Secondary | ICD-10-CM

## 2022-03-09 LAB — BASIC METABOLIC PANEL
BUN/Creatinine Ratio: 18 (ref 12–28)
BUN: 21 mg/dL (ref 8–27)
CO2: 26 mmol/L (ref 20–29)
Calcium: 9.8 mg/dL (ref 8.7–10.3)
Chloride: 99 mmol/L (ref 96–106)
Creatinine, Ser: 1.16 mg/dL — ABNORMAL HIGH (ref 0.57–1.00)
Glucose: 112 mg/dL — ABNORMAL HIGH (ref 70–99)
Potassium: 4.1 mmol/L (ref 3.5–5.2)
Sodium: 139 mmol/L (ref 134–144)
eGFR: 54 mL/min/{1.73_m2} — ABNORMAL LOW (ref >59)

## 2022-03-09 LAB — LIPID PANEL
Chol/HDL Ratio: 3.2 ratio (ref 0.0–4.4)
Cholesterol, Total: 264 mg/dL — ABNORMAL HIGH (ref 100–199)
HDL: 83 mg/dL (ref >39)
LDL Chol Calc (NIH): 164 mg/dL — ABNORMAL HIGH (ref 0–99)
Triglycerides: 100 mg/dL (ref 0–149)
VLDL Cholesterol Cal: 17 mg/dL (ref 5–40)

## 2022-03-10 ENCOUNTER — Encounter: Payer: Self-pay | Admitting: Physician Assistant

## 2022-03-10 ENCOUNTER — Ambulatory Visit (INDEPENDENT_AMBULATORY_CARE_PROVIDER_SITE_OTHER): Payer: BC Managed Care – PPO | Admitting: Physician Assistant

## 2022-03-10 VITALS — BP 110/72 | HR 85 | Temp 97.3°F | Ht 62.0 in | Wt 250.0 lb

## 2022-03-10 DIAGNOSIS — Z0000 Encounter for general adult medical examination without abnormal findings: Secondary | ICD-10-CM

## 2022-03-10 NOTE — Progress Notes (Signed)
Subjective:    Patient ID: Malen Gauze, female    DOB: 07-Jun-1960, 62 y.o.   MRN: [id number]  Chief Complaint  Patient presents with   Annual Exam    Pt in office for annual; pt is not fasting for labs ate breakfast; pt concerned with gaining weight faster;     HPI Patient is in today for annual exam.  Health maintenance: Lifestyle/ exercise: Stands at work all shift  Nutrition: Still eating about the same per patient  Mental health: Depression not as bad as it was, some days worse than others; stopped taking Prozac (says she ran out of this medication & didn't notice a difference one way or another). Sleep: Feeling tired during the day, but says she sleeps well; works night shift Substance use: None ETOH: None  Sexual activity: Not active  Immunizations: UTD  Colonoscopy: positive FOBT, supposed to be having colonoscopy done, but didn't have anyone to sit with her for the day  Pap: hx hysterectomy  Mammogram: scheduled in March  DEXA: Osteopenia - pt taking Calcium and Vit D   Skin: no issues Vision: Scheduling an appointment Dental: UTD    Past Medical History:  Diagnosis Date   Abnormal EKG    Cardiac murmur    CKD (chronic kidney disease)    Depression    Dyspnea    Elevated liver enzymes    HTN (hypertension)    Iron deficiency anemia    Lower extremity edema    Obesity     Past Surgical History:  Procedure Laterality Date   ABDOMINAL HYSTERECTOMY     TUBAL LIGATION      Family History  Problem Relation Age of Onset   Dementia Mother    Heart attack Father    Dementia Father    Hypertension Sister    Diabetes Brother    CVA Brother    Colon cancer Neg Hx    Stomach cancer Neg Hx    Esophageal cancer Neg Hx    Colon polyps Neg Hx     Social History   Tobacco Use   Smoking status: Never   Smokeless tobacco: Never  Vaping Use   Vaping Use: Never used  Substance Use Topics   Alcohol use: No    Alcohol/week: 0.0 standard drinks of alcohol    Drug use: No     No Known Allergies  Review of Systems NEGATIVE UNLESS OTHERWISE INDICATED IN HPI      Objective:     BP 110/72 (BP Location: Left Arm)   Pulse 85   Temp (!) 97.3 F (36.3 C) (Temporal)   Ht 5' 2"$  (1.575 m)   Wt 250 lb (113.4 kg)   SpO2 96%   BMI 45.73 kg/m   Wt Readings from Last 3 Encounters:  03/10/22 250 lb (113.4 kg)  02/24/22 251 lb (113.9 kg)  12/30/21 246 lb 3.2 oz (111.7 kg)    BP Readings from Last 3 Encounters:  03/10/22 110/72  02/24/22 122/72  12/30/21 118/82     Physical Exam Vitals and nursing note reviewed.  Constitutional:      Appearance: Normal appearance. She is obese. She is not toxic-appearing.  HENT:     Head: Normocephalic and atraumatic.     Right Ear: Tympanic membrane, ear canal and external ear normal.     Left Ear: Tympanic membrane, ear canal and external ear normal.     Nose: Nose normal.     Mouth/Throat:  Mouth: Mucous membranes are moist.  Eyes:     Extraocular Movements: Extraocular movements intact.     Conjunctiva/sclera: Conjunctivae normal.     Pupils: Pupils are equal, round, and reactive to light.  Cardiovascular:     Rate and Rhythm: Normal rate and regular rhythm.     Pulses: Normal pulses.     Heart sounds: Normal heart sounds.  Pulmonary:     Effort: Pulmonary effort is normal.     Breath sounds: Normal breath sounds.  Musculoskeletal:        General: Normal range of motion.     Cervical back: Normal range of motion and neck supple.     Right lower leg: Edema present.     Left lower leg: Edema present.  Skin:    General: Skin is warm and dry.  Neurological:     General: No focal deficit present.     Mental Status: She is alert and oriented to person, place, and time.  Psychiatric:        Mood and Affect: Mood normal.        Behavior: Behavior normal.        Assessment & Plan:  Encounter for annual physical exam   Reviewed patient's recent labs. Encouraged patient to work on  lifestyle changes. Exercise is limited because of work schedule. I think a majority of her fatigue and weight issues are from night shift / poor quality of sleep. Will consider sleep study in future.   Patient Counseling: [x]$   Nutrition: Stressed importance of moderation in sodium/caffeine intake, saturated fat and cholesterol, caloric balance, sufficient intake of fresh fruits, vegetables, and fiber.  [x]$   Stressed the importance of regular exercise.   [x]$   Substance Abuse: Discussed cessation/primary prevention of tobacco, alcohol, or other drug use; driving or other dangerous activities under the influence; availability of treatment for abuse.   [x]$   Injury prevention: Discussed safety belts, safety helmets, smoke detector, smoking near bedding or upholstery.   []$   Sexuality: Discussed sexually transmitted diseases, partner selection, use of condoms, avoidance of unintended pregnancy  and contraceptive alternatives.   [x]$   Dental health: Discussed importance of regular tooth brushing, flossing, and dental visits.  [x]$   Health maintenance and immunizations reviewed. Please refer to Health maintenance section.      Return in about 6 months (around 09/08/2022) for regular follow-up / med check .    Johathan Province M Dominique Ressel, PA-C

## 2022-03-15 ENCOUNTER — Other Ambulatory Visit: Payer: Self-pay | Admitting: Physician Assistant

## 2022-03-15 ENCOUNTER — Telehealth: Payer: Self-pay

## 2022-03-15 MED ORDER — ROSUVASTATIN CALCIUM 5 MG PO TABS: 5.0000 mg | ORAL_TABLET | Freq: Every day | ORAL | 3 refills | Status: DC

## 2022-03-15 NOTE — Telephone Encounter (Signed)
Last OV: 03/10/22  Next OV: 09/06/22  Last filled: 02/15/22  Quantity: 30 w/ 0 refills

## 2022-03-15 NOTE — Telephone Encounter (Signed)
-----   Message from Werner Lean, MD sent at 03/13/2022  2:30 PM EST ----- Results: Stable creatinine LDL above goal Plan: Rosuvastatin 5 and labs in three months  Werner Lean, MD

## 2022-03-15 NOTE — Telephone Encounter (Signed)
The patient has been notified of the result and verbalized understanding.  All questions (if any) were answered. [place], RN 03/15/2022 8:22 AM   Pt will have f/u fasting blood work at next [place] on 06/06/22.

## 2022-04-06 ENCOUNTER — Ambulatory Visit: Payer: BC Managed Care – PPO

## 2022-05-05 ENCOUNTER — Ambulatory Visit: Payer: BC Managed Care – PPO

## 2022-05-24 ENCOUNTER — Telehealth: Payer: Self-pay | Admitting: Physician Assistant

## 2022-05-24 ENCOUNTER — Other Ambulatory Visit: Payer: Self-pay | Admitting: Physician Assistant

## 2022-05-24 MED ORDER — GABAPENTIN 300 MG PO CAPS: 300.0000 mg | ORAL_CAPSULE | Freq: Every day | ORAL | 2 refills | Status: DC

## 2022-05-24 NOTE — Telephone Encounter (Signed)
Prescription Request  05/24/2022  LOV: 03/10/2022  What is the name of the medication or equipment?  gabapentin (NEURONTIN) 300 MG capsule   Have you contacted your pharmacy to request a refill? Yes   Which pharmacy would you like this sent to?  [facility] [address] [place] - [address] [place] [address] Phone: [phone] Fax: [phone]    Patient notified that their request is being sent to the clinical staff for review and that they should receive a response within 2 business days.   Please advise at Mobile [phone] (mobile)

## 2022-05-24 NOTE — Telephone Encounter (Signed)
Last OV: 03/10/22  Next OV: 09/06/22  Last Filled: 03/15/22  Quantity: 30 w/ 2 refills

## 2022-06-06 ENCOUNTER — Encounter: Payer: Self-pay | Admitting: Internal Medicine

## 2022-06-06 ENCOUNTER — Ambulatory Visit: Payer: BC Managed Care – PPO | Attending: Internal Medicine | Admitting: Internal Medicine

## 2022-06-06 ENCOUNTER — Other Ambulatory Visit: Payer: Self-pay | Admitting: Nurse Practitioner

## 2022-06-06 VITALS — BP 130/68 | HR 74 | Ht 62.0 in | Wt 249.8 lb

## 2022-06-06 DIAGNOSIS — K760 Fatty (change of) liver, not elsewhere classified: Secondary | ICD-10-CM | POA: Diagnosis not present

## 2022-06-06 DIAGNOSIS — I10 Essential (primary) hypertension: Secondary | ICD-10-CM | POA: Diagnosis not present

## 2022-06-06 DIAGNOSIS — R600 Localized edema: Secondary | ICD-10-CM

## 2022-06-06 DIAGNOSIS — K7589 Other specified inflammatory liver diseases: Secondary | ICD-10-CM | POA: Diagnosis not present

## 2022-06-06 DIAGNOSIS — E782 Mixed hyperlipidemia: Secondary | ICD-10-CM

## 2022-06-06 DIAGNOSIS — R7401 Elevation of levels of liver transaminase levels: Secondary | ICD-10-CM

## 2022-06-06 DIAGNOSIS — Z148 Genetic carrier of other disease: Secondary | ICD-10-CM | POA: Diagnosis not present

## 2022-06-06 DIAGNOSIS — I5032 Chronic diastolic (congestive) heart failure: Secondary | ICD-10-CM | POA: Diagnosis not present

## 2022-06-06 MED ORDER — SPIRONOLACTONE 25 MG PO TABS: 25.0000 mg | ORAL_TABLET | Freq: Every day | ORAL | 3 refills | Status: DC

## 2022-06-06 NOTE — Patient Instructions (Signed)
Medication Instructions:  Your physician has recommended you make the following change in your medication:   INCREASE: spironolactone (Aldactone) to 25 mg by mouth once daily  *If you need a refill on your cardiac medications before your next appointment, please call your pharmacy*   Lab Work: IN 7-10 DAYS: BMP  If you have labs (blood work) drawn today and your tests are completely normal, you will receive your results only by: MyChart Message (if you have MyChart) OR A paper copy in the mail If you have any lab test that is abnormal or we need to change your treatment, we will call you to review the results.   Testing/Procedures: NONE   Follow-Up: At [facility], you and your health needs are our priority.  As part of our continuing mission to provide you with exceptional heart care, we have created designated Provider Care Teams.  These Care Teams include your primary Cardiologist (physician) and Advanced Practice Providers (APPs -  Physician Assistants and Nurse Practitioners) who all work together to provide you with the care you need, when you need it.  We recommend signing up for the patient portal called "MyChart".  Sign up information is provided on this After Visit Summary.  MyChart is used to connect with patients for Virtual Visits (Telemedicine).  Patients are able to view lab/test results, encounter notes, upcoming appointments, etc.  Non-urgent messages can be sent to your provider as well.   To learn more about what you can do with MyChart, go to [email or web address].    Your next appointment:   3 month(s)  Provider:   Robin Searing, NP or Tereso Newcomer, PA-C

## 2022-06-06 NOTE — Progress Notes (Signed)
Cardiology Office Note:    Date:  06/06/2022   ID:  Alexandria Morrison, DOB 1960-07-01, MRN [id number]  PCP:  Allwardt, Crist Infante, PA-C   [facility] Providers Cardiologist:  Alexandria Constant, MD     Referring MD: Alexandria Leriche, PA-C   CC: HFpEF f/u  History of Present Illness:    Alexandria Morrison is a 62 y.o. female with a hx of HTN, Morbid Obesity, LE edema, and IDA who presents for evaluation.  2023: started on increased diuretic.  Negative LE duplex.  Started SGLT2i 2024: Had hypotension; gave BP cuff.  This resolves without change in medication.  Patient notes that she is doing well.   BP has resolved  No chest pain or pressure .  No SOB/DOE and no PND/Orthopnea. No palpitations or syncope . LE swelling  still persists.  She has n  Ambulatory blood pressure not done; we gave her a cuff at last visit.   Past Medical History:  Diagnosis Date   Abnormal EKG    Cardiac murmur    CKD (chronic kidney disease)    Depression    Dyspnea    Elevated liver enzymes    HTN (hypertension)    Iron deficiency anemia    Lower extremity edema    Obesity     Past Surgical History:  Procedure Laterality Date   ABDOMINAL HYSTERECTOMY     TUBAL LIGATION      Current Medications: Current Meds  Medication Sig   ascorbic acid (VITAMIN C) 1000 MG tablet Take 1,000 mg by mouth daily.   cholecalciferol (VITAMIN D3) 25 MCG (1000 UNIT) tablet Take 1,000 Units by mouth daily.   empagliflozin (JARDIANCE) 10 MG TABS tablet Take 1 tablet (10 mg total) by mouth daily before breakfast.   ferrous sulfate 325 (65 FE) MG tablet Take 1 tablet (325 mg total) by mouth every other day. Take with Vit C.   furosemide (LASIX) 40 MG tablet Take 60 mg by mouth daily. 1 1/2 tab daily (60mg )   gabapentin (NEURONTIN) 300 MG capsule Take 1 capsule (300 mg total) by mouth at bedtime.   magnesium 30 MG tablet Take 30 mg by mouth 2 (two) times daily.   Na Sulfate-K Sulfate-Mg Sulf  17.5-3.13-1.6 GM/177ML SOLN Use as directed; may use generic; goodrx card if insurance will not cover generic   potassium chloride SA (KLOR-CON M) 20 MEQ tablet Take 2 tablets (40 mEq total) by mouth daily.   rosuvastatin (CRESTOR) 5 MG tablet Take 1 tablet (5 mg total) by mouth daily.   spironolactone (ALDACTONE) 25 MG tablet Take 1 tablet (25 mg total) by mouth daily.   vitamin B-12 (CYANOCOBALAMIN) 1000 MCG tablet Take 1,000 mcg by mouth daily.   [DISCONTINUED] spironolactone (ALDACTONE) 25 MG tablet Take 0.5 tablets (12.5 mg total) by mouth daily.     Allergies:   Patient has no known allergies.   Social History   Socioeconomic History   Marital status: Single    Spouse name: Not on file   Number of children: Not on file   Years of education: Not on file   Highest education level: Not on file  Occupational History   Not on file  Tobacco Use   Smoking status: Never   Smokeless tobacco: Never  Vaping Use   Vaping Use: Never used  Substance and Sexual Activity   Alcohol use: No    Alcohol/week: 0.0 standard drinks of alcohol   Drug use: No  Sexual activity: Not on file  Other Topics Concern   Not on file  Social History Narrative   Not on file   Social Determinants of Health   Financial Resource Strain: Not on file  Food Insecurity: Not on file  Transportation Needs: Not on file  Physical Activity: Not on file  Stress: Not on file  Social Connections: Not on file     Family History: The patient's family history includes CVA in her brother; Dementia in her father and mother; Diabetes in her brother; Heart attack in her father; Hypertension in her sister. There is no history of Colon cancer, Stomach cancer, Esophageal cancer, or Colon polyps. Brother died of heart disease at age of 52; unclear cardiac etiology  ROS:   Please see the history of present illness.     All other systems reviewed and are negative.  EKGs/Labs/Other Studies Reviewed:    The following  studies were reviewed today:  EKG:   08/26/21 SR inferior MI  Cardiac Studies & Procedures       ECHOCARDIOGRAM  ECHOCARDIOGRAM COMPLETE 09/23/2021  Narrative ECHOCARDIOGRAM REPORT    Patient Name:   Alexandria Morrison Date of Exam: 09/23/2021 Medical Rec #:  [id number]      Height:       62.0 in Accession #:    [id number]     Weight:       245.0 lb Date of Birth:  12-28-1960      BSA:          2.084 m Patient Age:    61 years       BP:           112/70 mmHg Patient Gender: F              HR:           91 bpm. Exam Location:  [address]  Procedure: 2D Echo, Cardiac Doppler, Color Doppler, 3D Echo and Strain Analysis  Indications:    R60.0 Lower extremity edema  History:        Patient has no prior history of Echocardiogram examinations. Signs/Symptoms:Murmur; Risk Factors:Hypertension.  Sonographer:    Alexandria Morrison RDCS Referring Phys: [facility] A Tanny Harnack  IMPRESSIONS   1. Left ventricular ejection fraction, by estimation, is 60 to 65%. The left ventricle has normal function. The left ventricle has no regional wall motion abnormalities. There is mild left ventricular hypertrophy. Left ventricular diastolic parameters are consistent with Grade I diastolic dysfunction (impaired relaxation). 2. Right ventricular systolic function is normal. The right ventricular size is normal. Tricuspid regurgitation signal is inadequate for assessing PA pressure. 3. The mitral valve is grossly normal. Trivial mitral valve regurgitation. 4. The aortic valve is tricuspid. Aortic valve regurgitation is not visualized. 5. Aortic dilatation noted. There is borderline dilatation of the ascending aorta, measuring 39 mm. 6. The inferior vena cava is normal in size with <50% respiratory variability, suggesting right atrial pressure of 8 mmHg.  Comparison(s): No prior Echocardiogram.  FINDINGS Left Ventricle: Left ventricular ejection fraction, by estimation, is 60 to 65%. The left ventricle  has normal function. The left ventricle has no regional wall motion abnormalities. 3D left ventricular ejection fraction analysis performed but not reported based on interpreter judgement due to suboptimal tracking. The left ventricular internal cavity size was normal in size. There is mild left ventricular hypertrophy. Left ventricular diastolic parameters are consistent with Grade I diastolic dysfunction (impaired relaxation). Indeterminate filling pressures.  Right Ventricle: The right ventricular size  is normal. No increase in right ventricular wall thickness. Right ventricular systolic function is normal. Tricuspid regurgitation signal is inadequate for assessing PA pressure.  Left [facility]: Left atrial size was normal in size.  Right [facility]: Right atrial size was normal in size.  Pericardium: There is no evidence of pericardial effusion.  Mitral Valve: The mitral valve is grossly normal. Trivial mitral valve regurgitation.  Tricuspid Valve: The tricuspid valve is grossly normal. Tricuspid valve regurgitation is trivial.  Aortic Valve: The aortic valve is tricuspid. Aortic valve regurgitation is not visualized.  Pulmonic Valve: The pulmonic valve was normal in structure. Pulmonic valve regurgitation is not visualized.  Aorta: Aortic dilatation noted. There is borderline dilatation of the ascending aorta, measuring 39 mm.  Venous: The inferior vena cava is normal in size with less than 50% respiratory variability, suggesting right atrial pressure of 8 mmHg.  IAS/Shunts: No atrial level shunt detected by color flow Doppler.   LEFT VENTRICLE PLAX 2D LVIDd:         4.20 cm   Diastology LVIDs:         2.50 cm   LV e' medial:    11.30 cm/s LV PW:         1.10 cm   LV E/e' medial:  11.9 LV IVS:        1.10 cm   LV e' lateral:   10.30 cm/s LVOT diam:     1.90 cm   LV E/e' lateral: 13.0 LV SV:         73 LV SV Index:   35 LVOT Area:     2.84 cm  3D Volume EF: 3D EF:        54 % LV  EDV:       121 ml LV ESV:       55 ml LV SV:        65 ml  RIGHT VENTRICLE RV Basal diam:  3.40 cm RV Mid diam:    3.00 cm RV S prime:     18.40 cm/s TAPSE (M-mode): 3.4 cm  LEFT ATRIUM           Index        RIGHT ATRIUM           Index LA diam:      3.70 cm 1.78 cm/m   RA Area:     15.40 cm LA Vol (A4C): 52.7 ml 25.29 ml/m  RA Volume:   38.80 ml  18.62 ml/m AORTIC VALVE LVOT Vmax:   137.00 cm/s LVOT Vmean:  88.800 cm/s LVOT VTI:    0.259 m  AORTA Ao Root diam: 3.10 cm Ao Asc diam:  3.90 cm  MITRAL VALVE MV Area (PHT): 4.57 cm     SHUNTS MV Decel Time: 166 msec     Systemic VTI:  0.26 m MV E velocity: 134.00 cm/s  Systemic Diam: 1.90 cm MV A velocity: 147.00 cm/s MV E/A ratio:  0.91  Zoila Shutter MD Electronically signed by Zoila Shutter MD Signature Date/Time: 09/23/2021/12:15:25 PM    Final             Recent Labs: 08/26/2021: ALT 37; Hemoglobin 11.2; Platelets 215.0 09/14/2021: Magnesium 2.3 11/23/2021: NT-Pro BNP <36 03/09/2022: BUN 21; Creatinine, Ser 1.16; Potassium 4.1; Sodium 139  Recent Lipid Panel    Component Value Date/Time   CHOL 264 (H) 03/09/2022 0835   TRIG 100 03/09/2022 0835   HDL 83 03/09/2022 0835   CHOLHDL 3.2 03/09/2022 0835  CHOLHDL 3 11/06/2020 1012   VLDL 20.4 11/06/2020 1012   LDLCALC 164 (H) 03/09/2022 0835        Physical Exam:    VS:  BP 130/68   Pulse 74   Ht 5\' 2"  (1.575 m)   Wt 249 lb 12.8 oz (113.3 kg)   SpO2 97%   BMI 45.69 kg/m     Wt Readings from Last 3 Encounters:  06/06/22 249 lb 12.8 oz (113.3 kg)  03/10/22 250 lb (113.4 kg)  02/24/22 251 lb (113.9 kg)    Gen: no distress, morbid obesity   Neck: No JVD Cardiac: No Rubs or Gallops, RRR +2 radial pulses Respiratory: Clear to auscultation bilaterally, normal effort, normal  respiratory rate GI: Soft, nontender, non-distended  MS: Bilateral non pitting edema; moves all extremities Integument: Skin feels warm Neuro:  At time of evaluation, alert and  oriented to person/place/time/situation Psych: Normal affect, patient feels well  ASSESSMENT:    1. Chronic heart failure with preserved ejection fraction (HCC)   2. Mixed hyperlipidemia   3. Primary hypertension   4. Morbid obesity (HCC)   5. Bilateral lower extremity edema   6. 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) - on SGLT2i - Discussed the importance of fluid restriction of < 2 L, salt restriction, and checking daily weights  - ARB stopped for low BP - increasing MRA does and BMP in one week; may be able to lower her potassium supplements - continue compression stockings  HLD NAFLD Morbid obesity - labs pending from today - She is being seen by [facility], her goal is 1750 calories a day - In NAFLD patients, statin therapy is inversely associated with the presence of NASH  and may reduce the risk of fibrosis progression - Statins are safe to use in NAFLD patients, including those with mildly elevated liver enzymes (<3x upper limit of normal)   Three months APP Six months with me     Medication Adjustments/Labs and Tests Ordered: Current medicines are reviewed at length with the patient today.  Concerns regarding medicines are outlined above.  Orders Placed This Encounter  Procedures   Basic metabolic panel   Lipid panel   Meds ordered this encounter  Medications   spironolactone (ALDACTONE) 25 MG tablet    Sig: Take 1 tablet (25 mg total) by mouth daily.    Dispense:  90 tablet    Refill:  3    Patient Instructions  Medication Instructions:  Your physician has recommended you make the following change in your medication:   INCREASE: spironolactone (Aldactone) to 25 mg by mouth once daily  *If you need a refill on your cardiac medications before your next appointment, please call your  pharmacy*   Lab Work: IN 7-10 DAYS: BMP  If you have labs (blood work) drawn today and your tests are completely normal, you will receive your results only by: MyChart Message (if you have MyChart) OR A paper copy in the mail If you have any lab test that is abnormal or we need to change your treatment, we will call you to review the results.   Testing/Procedures: NONE   Follow-Up: At [facility], you and your health needs are our priority.  As part of our continuing mission to provide you with exceptional heart care,  we have created designated Provider Care Teams.  These Care Teams include your primary Cardiologist (physician) and Advanced Practice Providers (APPs -  Physician Assistants and Nurse Practitioners) who all work together to provide you with the care you need, when you need it.  We recommend signing up for the patient portal called "MyChart".  Sign up information is provided on this After Visit Summary.  MyChart is used to connect with patients for Virtual Visits (Telemedicine).  Patients are able to view lab/test results, encounter notes, upcoming appointments, etc.  Non-urgent messages can be sent to your provider as well.   To learn more about what you can do with MyChart, go to [email or web address].    Your next appointment:   3 month(s)  Provider:   Robin Searing, NP or Tereso Newcomer, PA-C        Signed, Alexandria Constant, MD  06/06/2022 12:50 PM    [facility]

## 2022-06-07 LAB — LIPID PANEL
Chol/HDL Ratio: 3 ratio (ref 0.0–4.4)
Cholesterol, Total: 214 mg/dL — ABNORMAL HIGH (ref 100–199)
HDL: 72 mg/dL (ref >39)
LDL Chol Calc (NIH): 125 mg/dL — ABNORMAL HIGH (ref 0–99)
Triglycerides: 99 mg/dL (ref 0–149)
VLDL Cholesterol Cal: 17 mg/dL (ref 5–40)

## 2022-06-08 ENCOUNTER — Telehealth: Payer: Self-pay

## 2022-06-08 MED ORDER — ROSUVASTATIN CALCIUM 10 MG PO TABS: 10.0000 mg | ORAL_TABLET | Freq: Every day | ORAL | 3 refills | Status: DC

## 2022-06-08 NOTE — Telephone Encounter (Signed)
The patient has been notified of the result and verbalized understanding.  All questions (if any) were answered. Arvid Right Devanshi Califf, RN 06/08/2022 9:35 AM   Scheduled 7-10 f/u lab work that was not scheduled at last OV.  Pt will come in on 06/21/22.  Pt will have repeat FLP, ALT, AST at 08/30/22 OV with Alden Server, NP. Note placed in appointment notes to draw labs.

## 2022-06-08 NOTE — Telephone Encounter (Signed)
-----   Message from Christell Constant, MD sent at 06/08/2022  7:50 AM EDT ----- Results: LDL still above goal in the setting of NAFLD Plan: Would increase rosuvastatin dose to 10 mg; AST and ALT in three months.  Christell Constant, MD

## 2022-06-15 ENCOUNTER — Ambulatory Visit
Admission: RE | Admit: 2022-06-15 | Discharge: 2022-06-15 | Disposition: A | Discharge status: Home / Self Care | Payer: BC Managed Care – PPO | Source: Ambulatory Visit | Attending: Physician Assistant | Admitting: Physician Assistant

## 2022-06-15 DIAGNOSIS — Z1231 Encounter for screening mammogram for malignant neoplasm of breast: Secondary | ICD-10-CM | POA: Diagnosis not present

## 2022-06-17 ENCOUNTER — Other Ambulatory Visit: Payer: Self-pay | Admitting: Physician Assistant

## 2022-06-17 DIAGNOSIS — R928 Other abnormal and inconclusive findings on diagnostic imaging of breast: Secondary | ICD-10-CM

## 2022-06-21 ENCOUNTER — Ambulatory Visit: Payer: BC Managed Care – PPO | Attending: Internal Medicine

## 2022-06-21 DIAGNOSIS — I5032 Chronic diastolic (congestive) heart failure: Secondary | ICD-10-CM | POA: Diagnosis not present

## 2022-06-21 IMAGING — US US ABDOMEN LIMITED
1 series · 14 of 25 positions shown · non-contrast
Comparison: Ultrasound 08/16/2017.

CLINICAL DATA: Elevated LFTs.

EXAM:
ULTRASOUND ABDOMEN LIMITED RIGHT UPPER QUADRANT

[Series 1: us abdomen limited · 0.20mm/px · 14 of 48 slices shown]
[im 1/48]
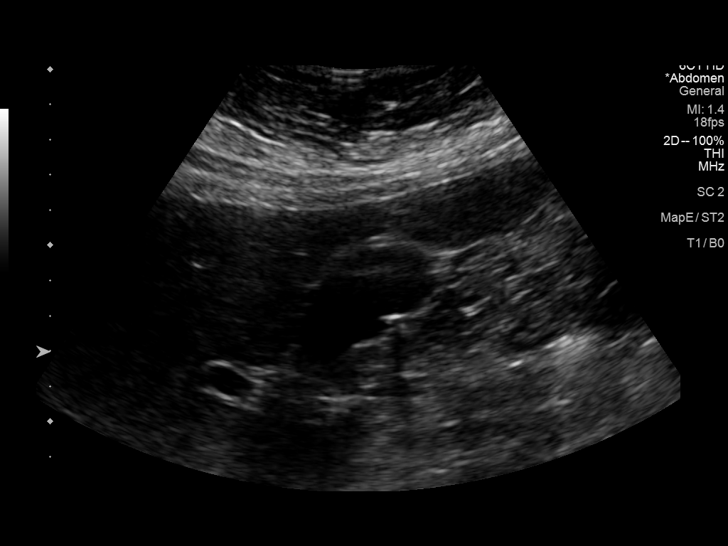
[im 4/48]
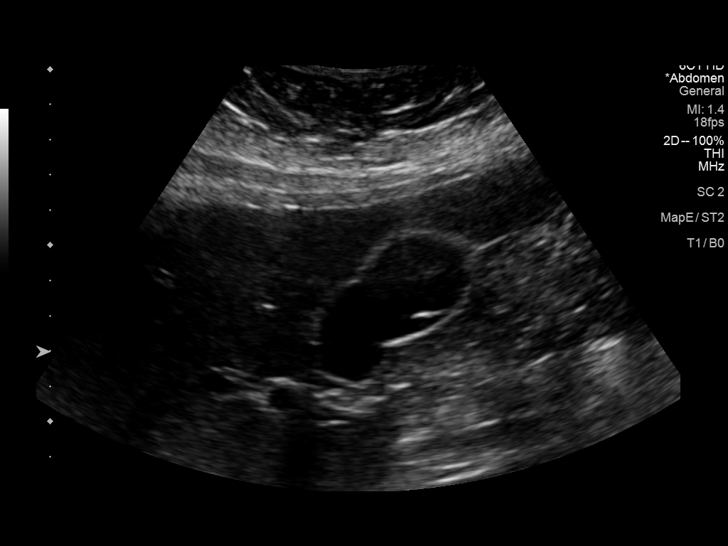
[im 8/48]
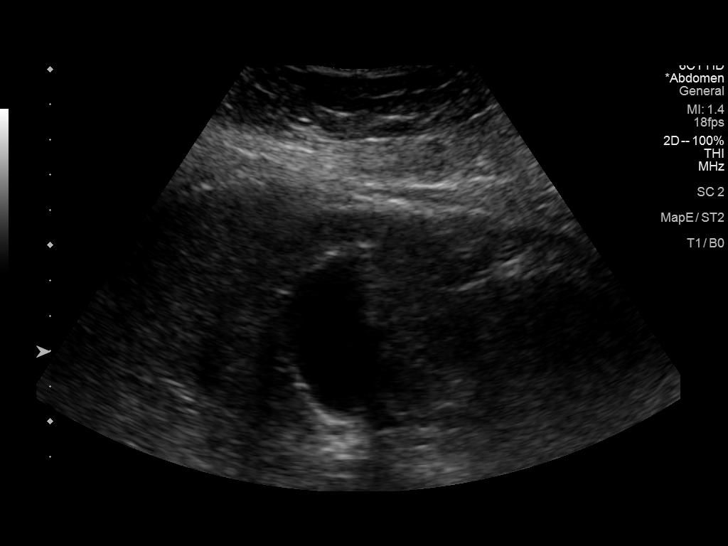
[im 12/48]
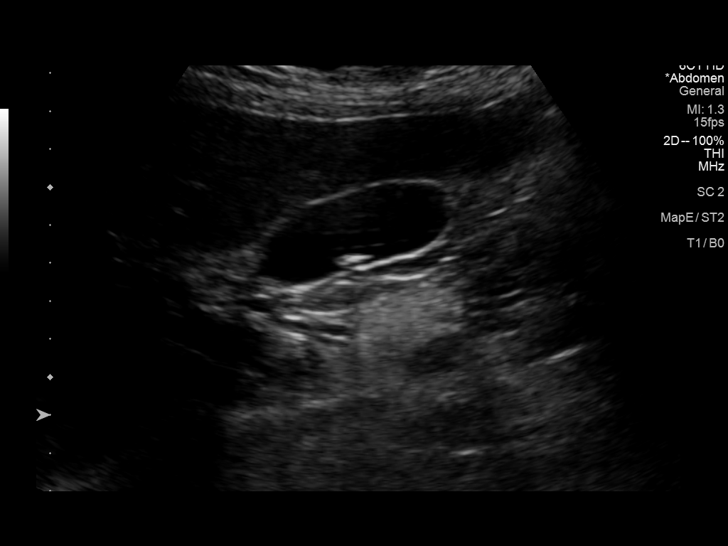
[im 16/48]
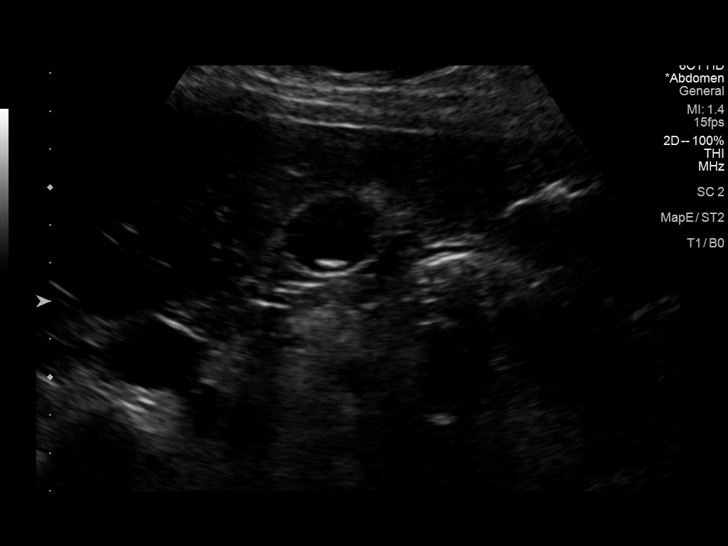
[im 18/48]
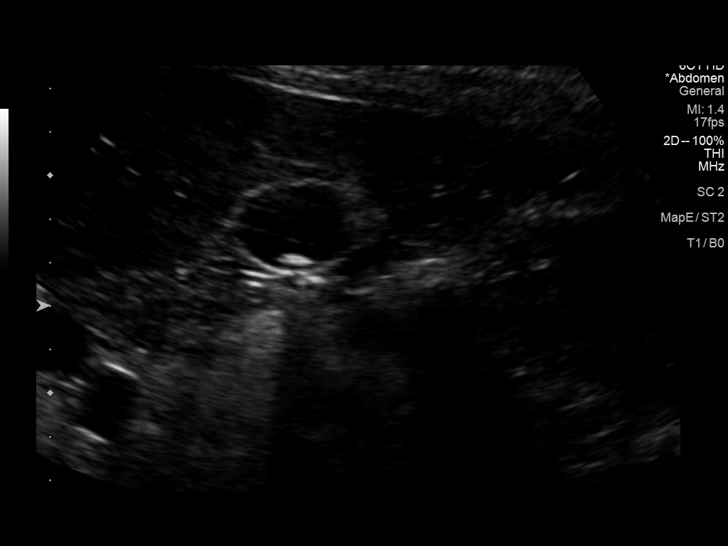
[im 22/48]
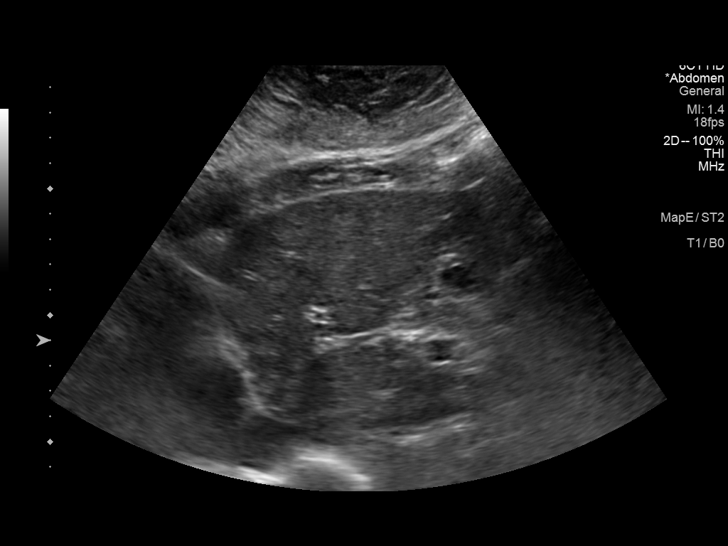
[im 26/48]
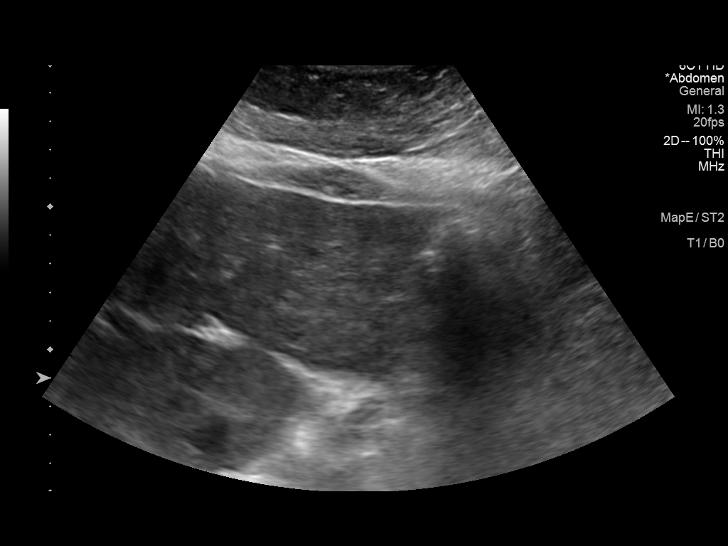
[im 30/48]
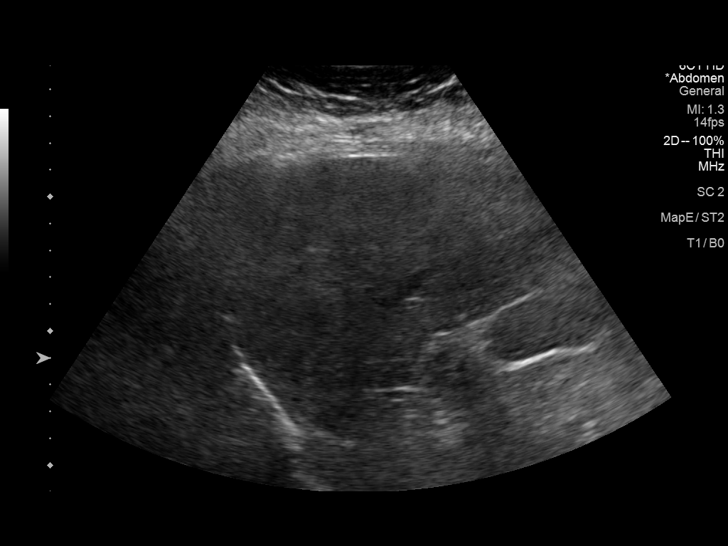
[im 32/48]
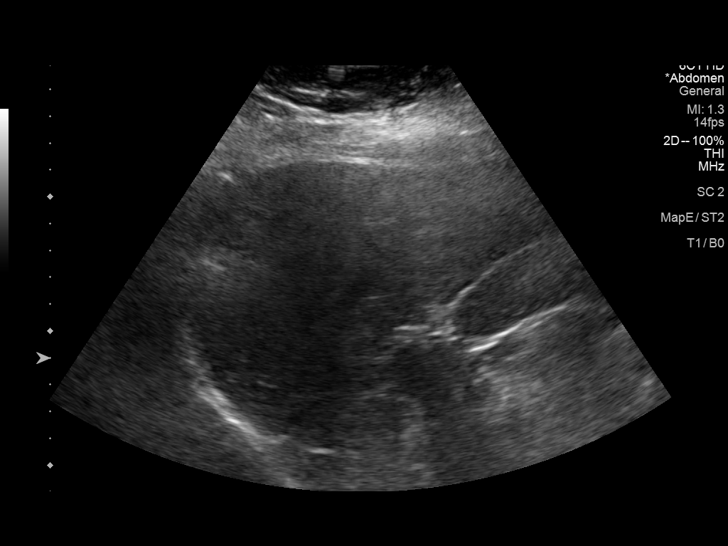
[im 36/48]
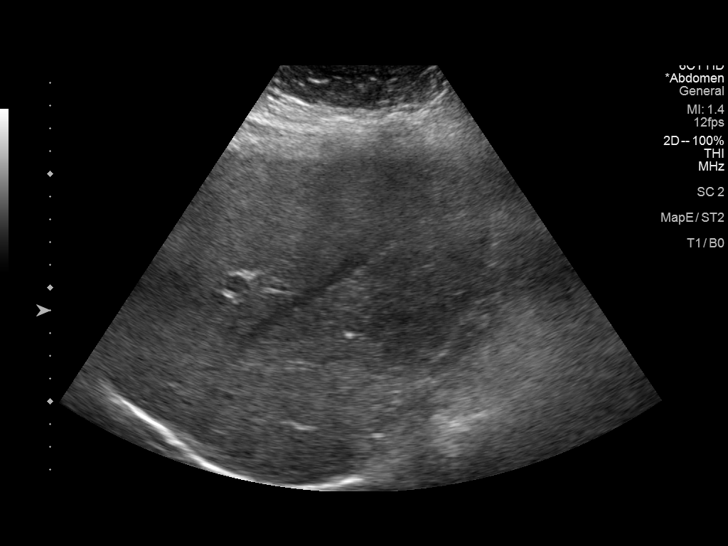
[im 40/48]
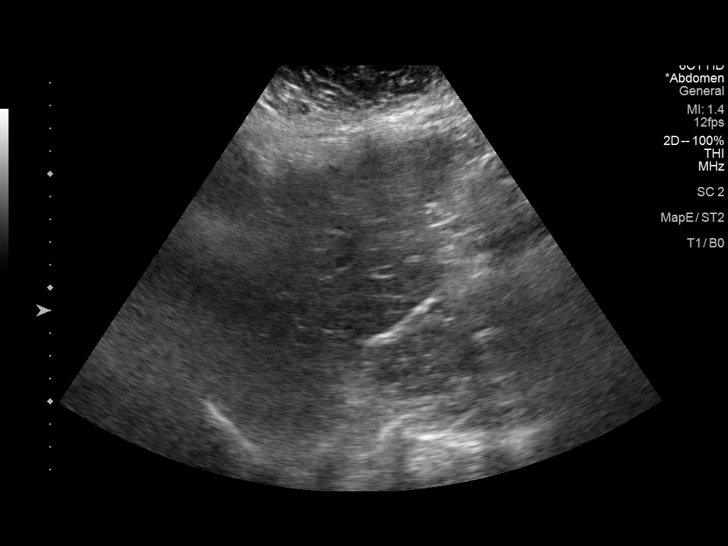
[im 44/48]
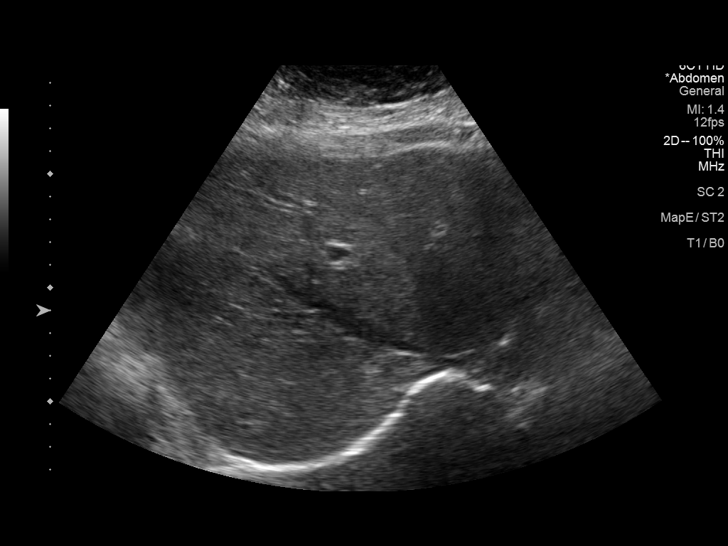
[im 48/48]
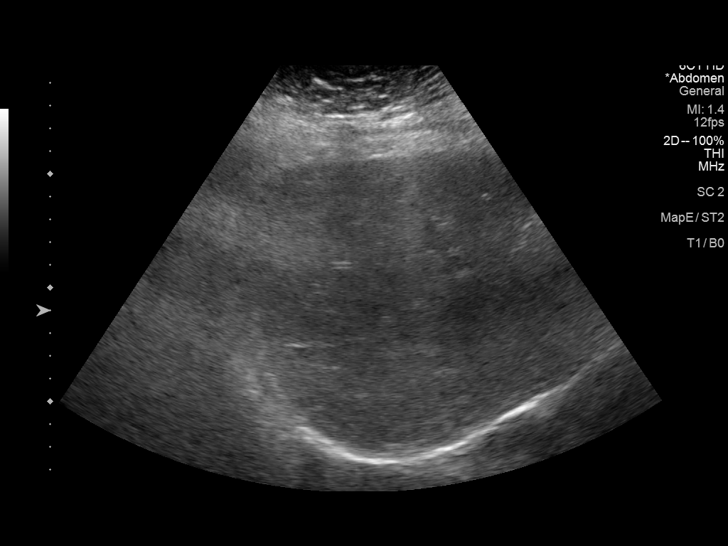

[14 of 25 positions shown; findings below may reference images not displayed]

FINDINGS: Gallbladder:

Stable appearing approximately 8 mm echogenic focus consistent with
tiny gallstone or polyp. No interim change from prior ultrasound of
08/16/2017. Gallbladder wall thickness normal. No pericholecystic
fluid collection. Negative Murphy sign.

Common bile duct:

Diameter: 4.3 mm

Liver:

Heterogeneous hepatic parenchymal pattern consistent with fatty
infiltration and or hepatocellular disease. No focal hepatic
abnormality identified. Portal vein is patent on color Doppler
imaging with normal direction of blood flow towards the liver.

Other: None.
IMPRESSION: 1. Noted within the gallbladder is a stable appearing approximately
8 mm echogenic focus consistent with tiny gallstone or polyp. No
interim change from prior ultrasound 08/16/2017. No evidence of
cholecystitis or biliary distention.

2. Heterogeneous hepatic parenchymal pattern consistent fatty
infiltration and or hepatocellular disease. No focal hepatic
abnormality identified.

## 2022-06-22 LAB — BASIC METABOLIC PANEL
BUN/Creatinine Ratio: 18 (ref 12–28)
BUN: 23 mg/dL (ref 8–27)
CO2: 24 mmol/L (ref 20–29)
Calcium: 9.8 mg/dL (ref 8.7–10.3)
Chloride: 99 mmol/L (ref 96–106)
Creatinine, Ser: 1.27 mg/dL — ABNORMAL HIGH (ref 0.57–1.00)
Glucose: 134 mg/dL — ABNORMAL HIGH (ref 70–99)
Potassium: 4.3 mmol/L (ref 3.5–5.2)
Sodium: 138 mmol/L (ref 134–144)
eGFR: 48 mL/min/{1.73_m2} — ABNORMAL LOW (ref >59)

## 2022-07-04 ENCOUNTER — Other Ambulatory Visit: Payer: BC Managed Care – PPO

## 2022-07-05 ENCOUNTER — Ambulatory Visit
Admission: RE | Admit: 2022-07-05 | Discharge: 2022-07-05 | Disposition: A | Discharge status: Home / Self Care | Payer: BC Managed Care – PPO | Source: Ambulatory Visit | Attending: Physician Assistant | Admitting: Physician Assistant

## 2022-07-05 ENCOUNTER — Other Ambulatory Visit: Payer: Self-pay | Admitting: Physician Assistant

## 2022-07-05 DIAGNOSIS — N63 Unspecified lump in unspecified breast: Secondary | ICD-10-CM | POA: Diagnosis not present

## 2022-07-05 DIAGNOSIS — R928 Other abnormal and inconclusive findings on diagnostic imaging of breast: Secondary | ICD-10-CM

## 2022-07-05 DIAGNOSIS — R922 Inconclusive mammogram: Secondary | ICD-10-CM | POA: Diagnosis not present

## 2022-07-06 ENCOUNTER — Other Ambulatory Visit: Payer: Self-pay | Admitting: Physician Assistant

## 2022-07-06 DIAGNOSIS — N6322 Unspecified lump in the left breast, upper inner quadrant: Secondary | ICD-10-CM

## 2022-07-14 ENCOUNTER — Ambulatory Visit
Admission: RE | Admit: 2022-07-14 | Discharge: 2022-07-14 | Disposition: A | Discharge status: Home / Self Care | Payer: BC Managed Care – PPO | Source: Ambulatory Visit | Attending: Nurse Practitioner | Admitting: Nurse Practitioner

## 2022-07-14 DIAGNOSIS — K824 Cholesterolosis of gallbladder: Secondary | ICD-10-CM | POA: Diagnosis not present

## 2022-07-14 DIAGNOSIS — K760 Fatty (change of) liver, not elsewhere classified: Secondary | ICD-10-CM

## 2022-07-14 DIAGNOSIS — R7401 Elevation of levels of liver transaminase levels: Secondary | ICD-10-CM

## 2022-08-24 ENCOUNTER — Encounter (INDEPENDENT_AMBULATORY_CARE_PROVIDER_SITE_OTHER): Payer: Self-pay

## 2022-08-25 ENCOUNTER — Other Ambulatory Visit: Payer: Self-pay | Admitting: Internal Medicine

## 2022-08-29 NOTE — Progress Notes (Unsigned)
Office Visit    Patient Name: Alexandria Morrison Date of Encounter: 08/29/2022  Primary Care Provider:  Allwardt, Crist Infante, PA-C Primary Cardiologist:  Christell Constant, MD Primary Electrophysiologist: None   Past Medical History    Past Medical History:  Diagnosis Date   Abnormal EKG    Cardiac murmur    CKD (chronic kidney disease)    Depression    Dyspnea    Elevated liver enzymes    HTN (hypertension)    Iron deficiency anemia    Lower extremity edema    Obesity    Past Surgical History:  Procedure Laterality Date   ABDOMINAL HYSTERECTOMY     TUBAL LIGATION      Allergies  No Known Allergies   History of Present Illness    Alexandria Morrison  is a 62 year old female with a PMH of HFpEF, hypertension, edema, HLD, IDA, NAFLD,CKD, obesity who presents today for 81-month follow-up.  Ms. Felder was seen initially by Dr. Raynelle Jan on 09/06/2021 with complaint of bilateral lower extremity.  New heart murmur on auscultation of physical exam and underwent TTE that showed of 60 to 65% with no RWMA and grade 1 DD with trivial MVR and borderline ascending aortic dilation.  She was advised to use compression stockings to lower extremities.  He was also started on an SGLT2 inhibitor and continued on potassium and magnesium supplement due to hypomagnesemia and hypokalemia.  Of Lasix 60 mg p.o. daily and blood pressures decreased which required ARB to be stopped.  He was last seen by Dr. Raynelle Jan on 06/06/2022 and had some improvement to blood pressure with adjustment.  She continued ARB and potassium supplement was reduced.   Since last being seen in the office patient reports***.  Patient denies chest pain, palpitations, dyspnea, PND, orthopnea, nausea, vomiting, dizziness, syncope, edema, weight gain, or early satiety.     ***Notes: -Patient needs fasting lipids and LFTs  Home Medications    Current Outpatient Medications  Medication Sig Dispense Refill   ascorbic  acid (VITAMIN C) 1000 MG tablet Take 1,000 mg by mouth daily.     cholecalciferol (VITAMIN D3) 25 MCG (1000 UNIT) tablet Take 1,000 Units by mouth daily.     empagliflozin (JARDIANCE) 10 MG TABS tablet Take 1 tablet (10 mg total) by mouth daily before breakfast. 90 tablet 3   ferrous sulfate 325 (65 FE) MG tablet Take 1 tablet (325 mg total) by mouth every other day. Take with Vit C. 30 tablet 3   furosemide (LASIX) 40 MG tablet TAKE 1 & 1/2 (ONE & ONE-HALF) TABLETS BY MOUTH ONCE DAILY 135 tablet 1   gabapentin (NEURONTIN) 300 MG capsule Take 1 capsule (300 mg total) by mouth at bedtime. 30 capsule 2   magnesium 30 MG tablet Take 30 mg by mouth 2 (two) times daily.     Na Sulfate-K Sulfate-Mg Sulf 17.5-3.13-1.6 GM/177ML SOLN Use as directed; may use generic; goodrx card if insurance will not cover generic 354 mL 0   potassium chloride SA (KLOR-CON M) 20 MEQ tablet Take 2 tablets (40 mEq total) by mouth daily. 180 tablet 3   rosuvastatin (CRESTOR) 10 MG tablet Take 1 tablet (10 mg total) by mouth daily. 90 tablet 3   spironolactone (ALDACTONE) 25 MG tablet Take 1 tablet (25 mg total) by mouth daily. 90 tablet 3   vitamin B-12 (CYANOCOBALAMIN) 1000 MCG tablet Take 1,000 mcg by mouth daily.     No current facility-administered medications  for this visit.     Review of Systems  Please see the history of present illness.    (+)*** (+)***  All other systems reviewed and are otherwise negative except as noted above.  Physical Exam    Wt Readings from Last 3 Encounters:  06/06/22 249 lb 12.8 oz (113.3 kg)  03/10/22 250 lb (113.4 kg)  02/24/22 251 lb (113.9 kg)   ZO:[id number] were no vitals filed for this visit.,There is no height or weight on file to calculate BMI.  Constitutional:      Appearance: Healthy appearance. Not in distress.  Neck:     Vascular: JVD normal.  Pulmonary:     Effort: Pulmonary effort is normal.     Breath sounds: No wheezing. No rales. Diminished in the  bases Cardiovascular:     Normal rate. Regular rhythm. Normal S1. Normal S2.      Murmurs: There is no murmur.  Edema:    Peripheral edema absent.  Abdominal:     Palpations: Abdomen is soft non tender. There is no hepatomegaly.  Skin:    General: Skin is warm and dry.  Neurological:     General: No focal deficit present.     Mental Status: Alert and oriented to person, place and time.     Cranial Nerves: Cranial nerves are intact.  EKG/LABS/ Recent Cardiac Studies    ECG personally reviewed by me today - ***   Risk Assessment/Calculations:   {Does this patient have ATRIAL FIBRILLATION?:[id number]}        Lab Results  Component Value Date   WBC 5.4 08/26/2021   HGB 11.2 (L) 08/26/2021   HCT 33.5 (L) 08/26/2021   MCV 87.3 08/26/2021   PLT 215.0 08/26/2021   Lab Results  Component Value Date   CREATININE 1.27 (H) 06/21/2022   BUN 23 06/21/2022   NA 138 06/21/2022   K 4.3 06/21/2022   CL 99 06/21/2022   CO2 24 06/21/2022   Lab Results  Component Value Date   ALT 37 (H) 08/26/2021   AST 28 08/26/2021   ALKPHOS 172 (H) 08/26/2021   BILITOT 0.5 08/26/2021   Lab Results  Component Value Date   CHOL 214 (H) 06/06/2022   HDL 72 06/06/2022   LDLCALC 125 (H) 06/06/2022   TRIG 99 06/06/2022   CHOLHDL 3.0 06/06/2022    Lab Results  Component Value Date   HGBA1C 5.7 (A) 12/30/2021     Assessment & Plan    1.  HFpEF: -2D echo completed 08/2021 with EF of 55-60% and grade 1 DD with no significant valvular abnormalities -Today patient reports***  2.  Essential hypertension: -Patient blood pressure today was*** -Continue***  3.  Hyperlipidemia: -Patient's last LDL cholesterol was*** -LFTs and lipids***  4.  Bilateral lower extremity edema: -Today patient reports***      Disposition: Follow-up with Christell Constant, MD or APP in *** months {Are you ordering a CV Procedure (e.g. stress test, cath, DCCV, TEE, etc)?   Press F2        :[id number]}    Medication Adjustments/Labs and Tests Ordered: Current medicines are reviewed at length with the patient today.  Concerns regarding medicines are outlined above.   Signed, Napoleon Form, Leodis Rains, NP 08/29/2022, 8:14 AM [facility]

## 2022-08-30 ENCOUNTER — Ambulatory Visit: Payer: BC Managed Care – PPO | Attending: Nurse Practitioner | Admitting: Nurse Practitioner

## 2022-08-30 ENCOUNTER — Encounter: Payer: Self-pay | Admitting: Nurse Practitioner

## 2022-08-30 VITALS — BP 110/80 | HR 79 | Ht 62.0 in | Wt 244.1 lb

## 2022-08-30 DIAGNOSIS — I5032 Chronic diastolic (congestive) heart failure: Secondary | ICD-10-CM

## 2022-08-30 DIAGNOSIS — E782 Mixed hyperlipidemia: Secondary | ICD-10-CM

## 2022-08-30 DIAGNOSIS — I10 Essential (primary) hypertension: Secondary | ICD-10-CM

## 2022-08-30 DIAGNOSIS — R600 Localized edema: Secondary | ICD-10-CM

## 2022-08-30 LAB — COMPREHENSIVE METABOLIC PANEL
ALT: 25 IU/L (ref 0–32)
AST: 24 IU/L (ref 0–40)
Albumin: 4.4 g/dL (ref 3.9–4.9)
Alkaline Phosphatase: 171 IU/L — ABNORMAL HIGH (ref 44–121)
BUN/Creatinine Ratio: 19 (ref 12–28)
BUN: 24 mg/dL (ref 8–27)
Bilirubin Total: 0.7 mg/dL (ref 0.0–1.2)
CO2: 23 mmol/L (ref 20–29)
Calcium: 9.8 mg/dL (ref 8.7–10.3)
Chloride: 103 mmol/L (ref 96–106)
Creatinine, Ser: 1.28 mg/dL — ABNORMAL HIGH (ref 0.57–1.00)
Globulin, Total: 2.7 g/dL (ref 1.5–4.5)
Glucose: 100 mg/dL — ABNORMAL HIGH (ref 70–99)
Potassium: 4.1 mmol/L (ref 3.5–5.2)
Sodium: 142 mmol/L (ref 134–144)
Total Protein: 7.1 g/dL (ref 6.0–8.5)
eGFR: 47 mL/min/{1.73_m2} — ABNORMAL LOW (ref >59)

## 2022-08-30 LAB — LIPID PANEL
Chol/HDL Ratio: 2.8 ratio (ref 0.0–4.4)
Cholesterol, Total: 200 mg/dL — ABNORMAL HIGH (ref 100–199)
HDL: 72 mg/dL (ref >39)
LDL Chol Calc (NIH): 115 mg/dL — ABNORMAL HIGH (ref 0–99)
Triglycerides: 69 mg/dL (ref 0–149)
VLDL Cholesterol Cal: 13 mg/dL (ref 5–40)

## 2022-08-30 NOTE — Patient Instructions (Addendum)
Medication Instructions:  Your physician recommends that you continue on your current medications as directed. Please refer to the Current Medication list given to you today. *If you need a refill on your cardiac medications before your next appointment, please call your pharmacy*   Lab Work: TODAY-CMET & LIPIDS If you have labs (blood work) drawn today and your tests are completely normal, you will receive your results only by: MyChart Message (if you have MyChart) OR A paper copy in the mail If you have any lab test that is abnormal or we need to change your treatment, we will call you to review the results.   Testing/Procedures: NONE ORDERED   Follow-Up: At [facility], you and your health needs are our priority.  As part of our continuing mission to provide you with exceptional heart care, we have created designated Provider Care Teams.  These Care Teams include your primary Cardiologist (physician) and Advanced Practice Providers (APPs -  Physician Assistants and Nurse Practitioners) who all work together to provide you with the care you need, when you need it.  We recommend signing up for the patient portal called "MyChart".  Sign up information is provided on this After Visit Summary.  MyChart is used to connect with patients for Virtual Visits (Telemedicine).  Patients are able to view lab/test results, encounter notes, upcoming appointments, etc.  Non-urgent messages can be sent to your provider as well.   To learn more about what you can do with MyChart, go to [email or web address].    Your next appointment:   3 month(s)  Provider:   Robin Searing, NP       Other Instructions PLEASE INCREASE WATER INTAKE TO AT LEAST TO 64OZs INCREASE EXERCISE TO 150 MINUTES A WEEK   CHECK YOUR WEIGHT DAILY  WEAR YOUR TED HOSE DAILY

## 2022-09-06 ENCOUNTER — Ambulatory Visit: Payer: BC Managed Care – PPO | Admitting: Physician Assistant

## 2022-09-07 ENCOUNTER — Telehealth: Payer: Self-pay | Admitting: Nurse Practitioner

## 2022-09-07 DIAGNOSIS — Z79899 Other long term (current) drug therapy: Secondary | ICD-10-CM

## 2022-09-07 DIAGNOSIS — E782 Mixed hyperlipidemia: Secondary | ICD-10-CM

## 2022-09-07 MED ORDER — ROSUVASTATIN CALCIUM 20 MG PO TABS: 20.0000 mg | ORAL_TABLET | Freq: Every day | ORAL | 3 refills | Status: AC

## 2022-09-07 NOTE — Telephone Encounter (Signed)
Pt returning call for lab results  

## 2022-09-07 NOTE — Telephone Encounter (Signed)
Spoke with patient and discussed lab results and recommendations.  Per Alden Server: Please let patient know that her liver function is slightly elevated but stable and renal function is also slightly elevated but stable.  Your cholesterol numbers are still above goal and LDL cholesterol is 115 which should be less than 70.     Plan: -Please increase Crestor to 20 mg daily recheck LFTs and lipids in 8 weeks. -Please increase consumption of foods that help lower cholesterol include vegetables, fruits, nuts, whole grains, lean protein, fish, foods rich in soluble fiber -Please work on increasing your physical activity to at least 150 minutes/week which will also help lower your cholesterol naturally. -Please let me know if you have any further questions.  Rosuvastatin 20mg  daily sent to preferred pharmacy. Lipids and LFTs ordered, lab appt scheduled for 11/03/22.  Patient verbalized understanding and expressed appreciation for call.

## 2022-09-08 ENCOUNTER — Encounter (INDEPENDENT_AMBULATORY_CARE_PROVIDER_SITE_OTHER): Payer: Self-pay

## 2022-09-27 ENCOUNTER — Encounter: Payer: Self-pay | Admitting: Physician Assistant

## 2022-09-27 ENCOUNTER — Ambulatory Visit: Payer: BC Managed Care – PPO | Admitting: Physician Assistant

## 2022-09-27 VITALS — BP 132/82 | HR 82 | Temp 97.7°F | Ht 62.0 in | Wt 249.4 lb

## 2022-09-27 DIAGNOSIS — R5383 Other fatigue: Secondary | ICD-10-CM

## 2022-09-27 DIAGNOSIS — Z23 Encounter for immunization: Secondary | ICD-10-CM | POA: Diagnosis not present

## 2022-09-27 DIAGNOSIS — F339 Major depressive disorder, recurrent, unspecified: Secondary | ICD-10-CM

## 2022-09-27 DIAGNOSIS — R7303 Prediabetes: Secondary | ICD-10-CM

## 2022-09-27 DIAGNOSIS — Z72820 Sleep deprivation: Secondary | ICD-10-CM

## 2022-09-27 LAB — POCT GLYCOSYLATED HEMOGLOBIN (HGB A1C): Hemoglobin A1C: 6.3 % — AB (ref 4.0–5.6)

## 2022-09-27 MED ORDER — BUPROPION HCL ER (XL) 150 MG PO TB24
150.0000 mg | ORAL_TABLET | Freq: Every day | ORAL | 2 refills | Status: DC
Start: 2022-09-27 — End: 2022-12-05

## 2022-09-27 NOTE — Progress Notes (Signed)
Subjective:    Patient ID: Alexandria Morrison, female    DOB: 1960/05/08, 62 y.o.   MRN: [id number]  Chief Complaint  Patient presents with   Follow-up    Pt is concerned with weight gain, believes it can be from depression, experiencing tiredness, low energy for a few months     HPI Patient is in today for follow-up. Still feeling "gloomy." Thinks depression is driving some weight gain and fatigue. Appetite been about the same. Prozac didn't seem to help much.   Works 12 hour shifts, on days off, sleeps most of the time, but very broken sleep. Denies snoring or waking up gasping for air.   Past Medical History:  Diagnosis Date   Abnormal EKG    Cardiac murmur    CKD (chronic kidney disease)    Depression    Dyspnea    Elevated liver enzymes    HTN (hypertension)    Iron deficiency anemia    Lower extremity edema    Obesity     Past Surgical History:  Procedure Laterality Date   ABDOMINAL HYSTERECTOMY     TUBAL LIGATION      Family History  Problem Relation Age of Onset   Dementia Mother    Heart attack Father    Dementia Father    Hypertension Sister    Diabetes Brother    CVA Brother    Colon cancer Neg Hx    Stomach cancer Neg Hx    Esophageal cancer Neg Hx    Colon polyps Neg Hx     Social History   Tobacco Use   Smoking status: Never   Smokeless tobacco: Never  Vaping Use   Vaping status: Never Used  Substance Use Topics   Alcohol use: No    Alcohol/week: 0.0 standard drinks of alcohol   Drug use: No     No Known Allergies  Review of Systems NEGATIVE UNLESS OTHERWISE INDICATED IN HPI      Objective:     BP 132/82   Pulse 82   Temp 97.7 F (36.5 C) (Temporal)   Ht 5\' 2"  (1.575 m)   Wt 249 lb 6.4 oz (113.1 kg)   SpO2 98%   BMI 45.62 kg/m   Wt Readings from Last 3 Encounters:  09/27/22 249 lb 6.4 oz (113.1 kg)  08/30/22 244 lb 1 oz (110.7 kg)  06/06/22 249 lb 12.8 oz (113.3 kg)    BP Readings from Last 3 Encounters:  09/27/22  132/82  08/30/22 110/80  06/06/22 130/68     Physical Exam Vitals and nursing note reviewed.  Constitutional:      Appearance: Normal appearance. She is obese. She is not toxic-appearing.  HENT:     Head: Normocephalic and atraumatic.     Right Ear: Tympanic membrane, ear canal and external ear normal.     Left Ear: Tympanic membrane, ear canal and external ear normal.     Nose: Nose normal.     Mouth/Throat:     Mouth: Mucous membranes are moist.  Eyes:     Extraocular Movements: Extraocular movements intact.     Conjunctiva/sclera: Conjunctivae normal.     Pupils: Pupils are equal, round, and reactive to light.  Cardiovascular:     Rate and Rhythm: Normal rate and regular rhythm.     Pulses: Normal pulses.     Heart sounds: Normal heart sounds.  Pulmonary:     Effort: Pulmonary effort is normal.     Breath sounds:  Normal breath sounds.  Musculoskeletal:        General: Normal range of motion.     Cervical back: Normal range of motion and neck supple.     Right lower leg: Edema present.     Left lower leg: Edema present.  Skin:    General: Skin is warm and dry.  Neurological:     General: No focal deficit present.     Mental Status: She is alert and oriented to person, place, and time.  Psychiatric:        Mood and Affect: Mood normal.        Behavior: Behavior normal.        Assessment & Plan:  Prediabetes Assessment & Plan: Lab Results  Component Value Date   HGBA1C 6.3 (A) 09/27/2022   HGBA1C 5.7 (A) 12/30/2021   HGBA1C 6.3 05/20/2021    Chronic, monitor. Cont Jardiance 10 mg. Monitor sweets / cont work on lifestyle.   Orders: -     POCT glycosylated hemoglobin (Hb A1C)  Other fatigue -     buPROPion HCl ER (XL); Take 1 tablet (150 mg total) by mouth daily. Take one tablet by mouth every morning for 7 days, and then increase to two tablets by mouth every morning.  Dispense: 30 tablet; Refill: 2  Morbid obesity (HCC) Assessment & Plan: Stable,  chronic; needs to work on lifestyle changes; poor sleep likely contributing - consider sleep study - she wants to wait on this right now. Add Wellbutrin daily. Pt aware of risks vs benefits and possible adverse reactions.   Poor sleep  Depression, recurrent (HCC) Assessment & Plan: Prozac ineffective. Start Wellbutrin as directed. Look into sleep study.  Orders: -     buPROPion HCl ER (XL); Take 1 tablet (150 mg total) by mouth daily. Take one tablet by mouth every morning for 7 days, and then increase to two tablets by mouth every morning.  Dispense: 30 tablet; Refill: 2  Encounter for immunization -     Flu Vaccine Trivalent High Dose (Fluad)        Return in about 8 weeks (around 11/22/2022) for recheck/follow-up med check (Wellbutrin) .   Clayborn Milnes M Malakhai Beitler, PA-C

## 2022-09-27 NOTE — Patient Instructions (Signed)
Keep working on getting a support person for your liver biopsy / EGD / colonoscopy. Reach out if still having trouble with finding someone.   Start Wellbutrin daily to help with energy, depression, weight.  Keep working on lifestyle changes. Prediabetes still present.

## 2022-09-27 NOTE — Assessment & Plan Note (Signed)
Prozac ineffective. Start Wellbutrin as directed. Look into sleep study.

## 2022-09-27 NOTE — Assessment & Plan Note (Signed)
Stable, chronic; needs to work on lifestyle changes; poor sleep likely contributing - consider sleep study - she wants to wait on this right now. Add Wellbutrin daily. Pt aware of risks vs benefits and possible adverse reactions.

## 2022-09-27 NOTE — Assessment & Plan Note (Signed)
Lab Results  Component Value Date   HGBA1C 6.3 (A) 09/27/2022   HGBA1C 5.7 (A) 12/30/2021   HGBA1C 6.3 05/20/2021    Chronic, monitor. Cont Jardiance 10 mg. Monitor sweets / cont work on lifestyle.

## 2022-10-10 ENCOUNTER — Telehealth: Payer: Self-pay | Admitting: Physician Assistant

## 2022-10-10 NOTE — Telephone Encounter (Signed)
Patient requests to be called re: side effects Patient is having from buPROPion (WELLBUTRIN XL) 150 MG 24 hr tablet

## 2022-10-11 NOTE — Telephone Encounter (Signed)
Spoke with patient regarding results/recommendations. Pt states she is having bad dreams due to wellbutrin and memory changes. Pt states that she works nights so she took it before she went to work and while driving things did not look familiar and it scared her. Please advise

## 2022-10-29 ENCOUNTER — Other Ambulatory Visit: Payer: Self-pay | Admitting: Internal Medicine

## 2022-11-03 ENCOUNTER — Ambulatory Visit: Payer: BC Managed Care – PPO

## 2022-11-08 ENCOUNTER — Ambulatory Visit: Payer: BC Managed Care – PPO | Attending: Nurse Practitioner

## 2022-11-08 DIAGNOSIS — E782 Mixed hyperlipidemia: Secondary | ICD-10-CM

## 2022-11-08 DIAGNOSIS — Z79899 Other long term (current) drug therapy: Secondary | ICD-10-CM | POA: Diagnosis not present

## 2022-11-09 LAB — HEPATIC FUNCTION PANEL
ALT: 24 [IU]/L (ref 0–32)
AST: 26 [IU]/L (ref 0–40)
Albumin: 4 g/dL (ref 3.9–4.9)
Alkaline Phosphatase: 116 [IU]/L (ref 44–121)
Bilirubin Total: 0.9 mg/dL (ref 0.0–1.2)
Bilirubin, Direct: 0.24 mg/dL (ref 0.00–0.40)
Total Protein: 6.4 g/dL (ref 6.0–8.5)

## 2022-11-09 LAB — LIPID PANEL
Chol/HDL Ratio: 2.7 {ratio} (ref 0.0–4.4)
Cholesterol, Total: 173 mg/dL (ref 100–199)
HDL: 63 mg/dL (ref >39)
LDL Chol Calc (NIH): 93 mg/dL (ref 0–99)
Triglycerides: 91 mg/dL (ref 0–149)
VLDL Cholesterol Cal: 17 mg/dL (ref 5–40)

## 2022-11-16 ENCOUNTER — Telehealth: Payer: Self-pay | Admitting: Internal Medicine

## 2022-11-16 MED ORDER — FUROSEMIDE 40 MG PO TABS: 60.0000 mg | ORAL_TABLET | Freq: Every day | ORAL | 1 refills | Status: AC

## 2022-11-16 NOTE — Telephone Encounter (Signed)
Pt advised her Lipid results and verbalized understanding.

## 2022-11-16 NOTE — Telephone Encounter (Signed)
Patient is returning CMA's call for her lab results. Please advise. 

## 2022-11-21 ENCOUNTER — Ambulatory Visit: Payer: BC Managed Care – PPO | Admitting: Physician Assistant

## 2022-11-28 ENCOUNTER — Encounter: Payer: Self-pay | Admitting: Physician Assistant

## 2022-12-04 NOTE — Progress Notes (Unsigned)
Cardiology Office Note    Patient Name: Alexandria Morrison Date of Encounter: 12/05/2022  Primary Care Provider:  Allwardt, Crist Infante, PA-C Primary Cardiologist:  Christell Constant, MD Primary Electrophysiologist: None   Past Medical History    Past Medical History:  Diagnosis Date   Abnormal EKG    Cardiac murmur    CKD (chronic kidney disease)    Depression    Dyspnea    Elevated liver enzymes    HTN (hypertension)    Iron deficiency anemia    Lower extremity edema    Obesity     History of Present Illness   Alexandria Morrison  is a 62 year old female with a PMH of HFpEF, hypertension, edema, HLD, IDA, NAFLD,CKD, obesity who presents today for 21-month follow-up.   Ms. Vohra was last seen 08/30/2022 for 39-month follow-up and reported doing well with no new cardiac complaints.  Her blood pressure was controlled during visit euvolemic on exam but was noted to have 2+/3 chronic lower extremity edema.  She reported improvement with compression stockings and Lasix as well as elevation.  She was continued on current medication therapies at that time.  During today's visit the patient reports despite taking Lasix and wearing compression stockings, the swelling has not improved and even led to blistering a month ago. The patient reports no improvement in the swelling and denies any recent changes in diet or medication. 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 The patient also reports that he does not weigh herself regularly. Patient denies chest pain, palpitations, dyspnea, PND, orthopnea, nausea, vomiting, dizziness, syncope, edema, weight gain, or early satiety.  Review of Systems  Please see the history of present illness.    All other systems reviewed and are otherwise negative except as noted above.  Physical Exam    Wt Readings from Last 3 Encounters:  12/05/22  250 lb 9.6 oz (113.7 kg)  09/27/22 249 lb 6.4 oz (113.1 kg)  08/30/22 244 lb 1 oz (110.7 kg)   VS: Vitals:   12/05/22 1127  BP: 132/72  Pulse: 88  Resp: 16  SpO2: 95%  ,Body mass index is 45.84 kg/m. GEN: Well nourished, well developed in no acute distress Neck: No JVD; No carotid bruits Pulmonary: Clear to auscultation without rales, wheezing or rhonchi  Cardiovascular: Normal rate. Regular rhythm. Normal S1. Normal S2.   Murmurs: There is no murmur.  ABDOMEN: Soft, non-tender, non-distended EXTREMITIES: +2 bilateral pitting edema  EKG/LABS/ Recent Cardiac Studies   ECG personally reviewed by me today -none completed today  Risk Assessment/Calculations:          Lab Results  Component Value Date   WBC 5.4 08/26/2021   HGB 11.2 (L) 08/26/2021   HCT 33.5 (L) 08/26/2021   MCV 87.3 08/26/2021   PLT 215.0 08/26/2021   Lab Results  Component Value Date   CREATININE 1.28 (H) 08/30/2022   BUN 24 08/30/2022   NA 142 08/30/2022   K 4.1 08/30/2022   CL 103 08/30/2022   CO2 23 08/30/2022   Lab Results  Component Value Date   CHOL 173 11/08/2022   HDL 63 11/08/2022   LDLCALC 93 11/08/2022   TRIG 91 11/08/2022   CHOLHDL 2.7 11/08/2022    Lab Results  Component Value Date   HGBA1C 6.3 (A) 09/27/2022   Assessment & Plan  1.  HFpEF: -2D echo completed 08/2021 with EF of 55-60% and grade 1 DD with no significant valvular abnormalities -Today patient reports that she is wearing compression socks and taking Lasix as prescribed. -Continue spironolactone 25 mg daily, K-Dur 20 mEq daily, Jardiance 10 mg daily and Lasix 60 mg daily -Low sodium diet, fluid restriction <2L, and daily weights encouraged. Educated to contact our office for weight gain of 2 lbs overnight or 5 lbs in one week.    2.  Essential hypertension: -Patient blood pressure today was well-controlled at 132/72 -Continue spironolactone 25 mg daily   3.  Hyperlipidemia: -Patient's last LDL cholesterol  was 93 -Crestor was increased to 20 mg daily at previous visit with plan to recheck lipids/LFTs and 3 months   4.  Bilateral lower extremity edema: Persistent despite compression stockings and Lasix. Possible lymphedema. No improvement with salt restriction and fluid management. -Increase Lasix to 40mg  twice daily for 3 days, then return to 60mg  daily. -Check renal function today and in 1 week due to increased Lasix dose. -Consider referral to lymphedema specialist if no improvement with increased diuretic.   5.  Morbid obesity: -Patient's BMI is 44.64 kg/m -Patient was advised to moderate calorie intake and increase physical activity to at least 150 minutes/week.  Disposition: Follow-up with Christell Constant, MD or APP in 6 months   Signed, Napoleon Form, Leodis Rains, NP 12/05/2022, 11:37 AM [facility]

## 2022-12-05 ENCOUNTER — Other Ambulatory Visit: Payer: Self-pay

## 2022-12-05 ENCOUNTER — Ambulatory Visit: Payer: BC Managed Care – PPO | Attending: Nurse Practitioner | Admitting: Nurse Practitioner

## 2022-12-05 ENCOUNTER — Encounter: Payer: Self-pay | Admitting: Nurse Practitioner

## 2022-12-05 VITALS — BP 132/72 | HR 88 | Resp 16 | Ht 62.0 in | Wt 250.6 lb

## 2022-12-05 DIAGNOSIS — I10 Essential (primary) hypertension: Secondary | ICD-10-CM

## 2022-12-05 DIAGNOSIS — E782 Mixed hyperlipidemia: Secondary | ICD-10-CM | POA: Diagnosis not present

## 2022-12-05 DIAGNOSIS — R600 Localized edema: Secondary | ICD-10-CM | POA: Diagnosis not present

## 2022-12-05 DIAGNOSIS — I5032 Chronic diastolic (congestive) heart failure: Secondary | ICD-10-CM | POA: Diagnosis not present

## 2022-12-05 LAB — BASIC METABOLIC PANEL
BUN/Creatinine Ratio: 14 (ref 12–28)
BUN: 18 mg/dL (ref 8–27)
CO2: 25 mmol/L (ref 20–29)
Calcium: 9.4 mg/dL (ref 8.7–10.3)
Chloride: 101 mmol/L (ref 96–106)
Creatinine, Ser: 1.31 mg/dL — ABNORMAL HIGH (ref 0.57–1.00)
Glucose: 105 mg/dL — ABNORMAL HIGH (ref 70–99)
Potassium: 3.6 mmol/L (ref 3.5–5.2)
Sodium: 139 mmol/L (ref 134–144)
eGFR: 46 mL/min/{1.73_m2} — ABNORMAL LOW (ref >59)

## 2022-12-05 NOTE — Patient Instructions (Addendum)
Medication Instructions:  INCREASE Lasix to 40mg  Take a day for 3 days then go back to 60mg  daily *If you need a refill on your cardiac medications before your next appointment, please call your pharmacy*   Lab Work: TODAY-BMET 1 WEEK BMET  If you have labs (blood work) drawn today and your tests are completely normal, you will receive your results only by: MyChart Message (if you have MyChart) OR A paper copy in the mail If you have any lab test that is abnormal or we need to change your treatment, we will call you to review the results.   Testing/Procedures: NONE ORDERED   Follow-Up: At [facility], you and your health needs are our priority.  As part of our continuing mission to provide you with exceptional heart care, we have created designated Provider Care Teams.  These Care Teams include your primary Cardiologist (physician) and Advanced Practice Providers (APPs -  Physician Assistants and Nurse Practitioners) who all work together to provide you with the care you need, when you need it.  We recommend signing up for the patient portal called "MyChart".  Sign up information is provided on this After Visit Summary.  MyChart is used to connect with patients for Virtual Visits (Telemedicine).  Patients are able to view lab/test results, encounter notes, upcoming appointments, etc.  Non-urgent messages can be sent to your provider as well.   To learn more about what you can do with MyChart, go to [email or web address].    Your next appointment:   6 month(s)  Provider:   Christell Constant, MD      Other Instructions Please call the office next week and let us know how the swelling in your legs  GET SOME ZIP UP COMPRESSION [employer] OR [employer] DRINK 64-80oz daily Please get some ted hose or compression stockings. They can be purchased at your local medical supply store, [employer], [employer] or [employer] fund[employer].  Put them on first thing in the morning and wear them  during the day . Elevate your feet during the day and remove hose in the evening before bed. Please check your weight daily. Please contact the office if you gain more than 2lbs in a day or 5lbs in a week.  Limit your salt intake to 1500-2000mg  per day or 500mg  of Sodium per meal. Try to eat less tv dinners

## 2022-12-14 ENCOUNTER — Ambulatory Visit: Payer: BC Managed Care – PPO | Admitting: Physician Assistant

## 2022-12-15 DIAGNOSIS — E782 Mixed hyperlipidemia: Secondary | ICD-10-CM | POA: Diagnosis not present

## 2022-12-15 DIAGNOSIS — I5032 Chronic diastolic (congestive) heart failure: Secondary | ICD-10-CM | POA: Diagnosis not present

## 2022-12-15 DIAGNOSIS — I10 Essential (primary) hypertension: Secondary | ICD-10-CM | POA: Diagnosis not present

## 2022-12-16 LAB — BASIC METABOLIC PANEL
BUN/Creatinine Ratio: 13 (ref 12–28)
BUN: 15 mg/dL (ref 8–27)
CO2: 23 mmol/L (ref 20–29)
Calcium: 9.5 mg/dL (ref 8.7–10.3)
Chloride: 103 mmol/L (ref 96–106)
Creatinine, Ser: 1.18 mg/dL — ABNORMAL HIGH (ref 0.57–1.00)
Glucose: 125 mg/dL — ABNORMAL HIGH (ref 70–99)
Potassium: 3.6 mmol/L (ref 3.5–5.2)
Sodium: 140 mmol/L (ref 134–144)
eGFR: 52 mL/min/{1.73_m2} — ABNORMAL LOW (ref >59)

## 2022-12-20 ENCOUNTER — Ambulatory Visit: Payer: BC Managed Care – PPO | Admitting: Physician Assistant

## 2022-12-28 ENCOUNTER — Encounter: Payer: Self-pay | Admitting: Physician Assistant

## 2022-12-28 ENCOUNTER — Ambulatory Visit: Payer: BC Managed Care – PPO | Admitting: Physician Assistant

## 2022-12-28 VITALS — BP 136/80 | HR 78 | Temp 97.5°F | Ht 62.0 in | Wt 246.8 lb

## 2022-12-28 DIAGNOSIS — R600 Localized edema: Secondary | ICD-10-CM | POA: Diagnosis not present

## 2022-12-28 DIAGNOSIS — R7303 Prediabetes: Secondary | ICD-10-CM

## 2022-12-28 DIAGNOSIS — F339 Major depressive disorder, recurrent, unspecified: Secondary | ICD-10-CM | POA: Diagnosis not present

## 2022-12-28 LAB — POCT GLYCOSYLATED HEMOGLOBIN (HGB A1C): HbA1c POC (<> result, manual entry): 6.2 % (ref 4.0–5.6)

## 2022-12-28 MED ORDER — ESCITALOPRAM OXALATE 10 MG PO TABS
ORAL_TABLET | ORAL | 1 refills | Status: DC
Start: 2022-12-28 — End: 2023-01-31

## 2022-12-28 NOTE — Progress Notes (Signed)
Patient ID: Alexandria Morrison, female    DOB: [birth year], 62 y.o.   MRN: [id number]   Assessment & Plan:  Prediabetes -     POCT glycosylated hemoglobin (Hb A1C)  Depression, recurrent (HCC) -     Ambulatory referral to Psychology -     Escitalopram Oxalate; Take 1/2 tab po qAM with breakfast x 7 days, then take full tablet daily.  Dispense: 30 tablet; Refill: 1  Bilateral lower extremity edema   Assessment and Plan    Lower Extremity Edema Reports improvement with compression stockings, but struggles with daily use. -Encouraged to continue using compression stockings daily.  Depression Unfavorable response to Wellbutrin (hallucinations, disorientation) and partial response to Prozac in the past. Current symptoms are not as severe but still present. -Discontinue Wellbutrin due to adverse effects. -Initiate Lexapro, starting with half a tablet daily with breakfast for the first week, then increase to a full tablet if tolerated. -Consider counseling as an adjunct to pharmacotherapy.  Prediabetes Lab Results  Component Value Date   HGBA1C 6.2 12/28/2022   HGBA1C 6.3 (A) 09/27/2022   HGBA1C 5.7 (A) 12/30/2021   --Keep working on good lifestyle changes     Return in about 6 weeks (around 02/08/2023) for recheck/follow-up.    Subjective:    Chief Complaint  Patient presents with   Follow-up    Follow up for medications    HPI Discussed the use of AI scribe software for clinical note transcription with the patient, who gave verbal consent to proceed.  History of Present Illness   The patient, with a history of leg swelling and depression, reports that her legs feel swollen. She notes that if she does not wear compression socks, her feet will swell. The compression socks make a significant difference, but she sometimes needs a break from wearing them daily.  Regarding her depression, the patient had a negative experience with Wellbutrin, which caused her to hallucinate  and feel disoriented. She stopped taking it due to these side effects. She had previously tried Prozac, which had mixed results. She reports that her depression is not as deep as before, but she still feels down at times.       Past Medical History:  Diagnosis Date   Abnormal EKG    Cardiac murmur    CKD (chronic kidney disease)    Depression    Dyspnea    Elevated liver enzymes    HTN (hypertension)    Iron deficiency anemia    Lower extremity edema    Obesity     Past Surgical History:  Procedure Laterality Date   ABDOMINAL HYSTERECTOMY     TUBAL LIGATION      Family History  Problem Relation Age of Onset   Dementia Mother    Heart attack Father    Dementia Father    Hypertension Sister    Diabetes Brother    CVA Brother    Colon cancer Neg Hx    Stomach cancer Neg Hx    Esophageal cancer Neg Hx    Colon polyps Neg Hx     Social History   Tobacco Use   Smoking status: Never   Smokeless tobacco: Never  Vaping Use   Vaping status: Never Used  Substance Use Topics   Alcohol use: No    Alcohol/week: 0.0 standard drinks of alcohol   Drug use: No     Allergies  Allergen Reactions   Wellbutrin [Bupropion]     Bad dreams and  memory issues    Review of Systems NEGATIVE UNLESS OTHERWISE INDICATED IN HPI      Objective:     BP 136/80   Pulse 78   Temp (!) 97.5 F (36.4 C) (Temporal)   Ht 5\' 2"  (1.575 m)   Wt 246 lb 12.8 oz (111.9 kg)   SpO2 97%   BMI 45.14 kg/m   Wt Readings from Last 3 Encounters:  12/28/22 246 lb 12.8 oz (111.9 kg)  12/05/22 250 lb 9.6 oz (113.7 kg)  09/27/22 249 lb 6.4 oz (113.1 kg)    BP Readings from Last 3 Encounters:  12/28/22 136/80  12/05/22 132/72  09/27/22 132/82     Physical Exam Vitals and nursing note reviewed.  Constitutional:      Appearance: Normal appearance. She is obese. She is not toxic-appearing.  HENT:     Head: Normocephalic and atraumatic.     Right Ear: External ear normal.     Left Ear:  External ear normal.  Eyes:     Extraocular Movements: Extraocular movements intact.     Conjunctiva/sclera: Conjunctivae normal.     Pupils: Pupils are equal, round, and reactive to light.  Cardiovascular:     Rate and Rhythm: Normal rate and regular rhythm.     Pulses: Normal pulses.     Heart sounds: Normal heart sounds.  Pulmonary:     Effort: Pulmonary effort is normal.     Breath sounds: Normal breath sounds.  Musculoskeletal:        General: Normal range of motion.     Cervical back: Normal range of motion and neck supple.     Right lower leg: Edema present.     Left lower leg: Edema present.  Skin:    General: Skin is warm and dry.  Neurological:     General: No focal deficit present.     Mental Status: She is alert and oriented to person, place, and time.  Psychiatric:        Mood and Affect: Mood normal.        Behavior: Behavior normal.     Cyana Shook M Laporcha Marchesi, PA-C

## 2023-01-12 ENCOUNTER — Other Ambulatory Visit: Payer: Self-pay | Admitting: Physician Assistant

## 2023-01-12 ENCOUNTER — Ambulatory Visit
Admission: RE | Admit: 2023-01-12 | Discharge: 2023-01-12 | Disposition: A | Discharge status: Home / Self Care | Payer: BC Managed Care – PPO | Source: Ambulatory Visit | Attending: Physician Assistant | Admitting: Physician Assistant

## 2023-01-12 DIAGNOSIS — N6002 Solitary cyst of left breast: Secondary | ICD-10-CM | POA: Diagnosis not present

## 2023-01-12 DIAGNOSIS — N6322 Unspecified lump in the left breast, upper inner quadrant: Secondary | ICD-10-CM

## 2023-01-16 ENCOUNTER — Other Ambulatory Visit: Payer: Self-pay | Admitting: Physician Assistant

## 2023-01-16 DIAGNOSIS — N6320 Unspecified lump in the left breast, unspecified quadrant: Secondary | ICD-10-CM

## 2023-01-31 ENCOUNTER — Ambulatory Visit: Payer: BC Managed Care – PPO | Admitting: Physician Assistant

## 2023-01-31 ENCOUNTER — Encounter: Payer: Self-pay | Admitting: Physician Assistant

## 2023-01-31 DIAGNOSIS — F339 Major depressive disorder, recurrent, unspecified: Secondary | ICD-10-CM

## 2023-01-31 MED ORDER — ESCITALOPRAM OXALATE 10 MG PO TABS
10.0000 mg | ORAL_TABLET | Freq: Every day | ORAL | 2 refills | Status: DC
Start: 2023-01-31 — End: 2023-07-18

## 2023-01-31 NOTE — Progress Notes (Signed)
 Patient ID: ARLAYNE LIGGINS, female    DOB: [birth year], 63 y.o.   MRN: [id number]   Assessment & Plan:  Depression, recurrent (HCC) -     Escitalopram  Oxalate; Take 1 tablet (10 mg total) by mouth daily.  Dispense: 30 tablet; Refill: 2   Assessment and Plan    Depression On Lexapro  for about a month. No side effects reported. Patient reports feeling tired, but no improvement in depressive symptoms yet. Discussed that it may take a few more weeks for full effect. -Continue Lexapro . -Reevaluate in a few months. -Encouraged patient to engage in physical activity, suggested [employer] with Karleen on [address].      Return in about 4 months (around 05/31/2023) for recheck/follow-up.    Subjective:    Chief Complaint  Patient presents with   Medical Management of Chronic Issues    Pt in office 4-6 wk f/u; pt states she is just feeling extra tired and fatigued; pt admits she is sleeping well and no issues there;     HPI Discussed the use of AI scribe software for clinical note transcription with the patient, who gave verbal consent to proceed.  History of Present Illness   The patient, with a history of depression, presents for a follow-up visit after starting Lexapro . She reports no side effects from the medication, unlike a previous medication. She has not yet noticed a significant improvement in her depressive symptoms, but she understands that it may take a few weeks for the medication to take full effect. She has been feeling particularly tired lately, which she attributes to the winter season. She also mentions that she has been feeling sad.       Past Medical History:  Diagnosis Date   Abnormal EKG    Cardiac murmur    CKD (chronic kidney disease)    Depression    Dyspnea    Elevated liver enzymes    HTN (hypertension)    Iron deficiency anemia    Lower extremity edema    Obesity     Past Surgical History:  Procedure Laterality Date   ABDOMINAL HYSTERECTOMY      TUBAL LIGATION      Family History  Problem Relation Age of Onset   Dementia Mother    Heart attack Father    Dementia Father    Hypertension Sister    Diabetes Brother    CVA Brother    Colon cancer Neg Hx    Stomach cancer Neg Hx    Esophageal cancer Neg Hx    Colon polyps Neg Hx     Social History   Tobacco Use   Smoking status: Never   Smokeless tobacco: Never  Vaping Use   Vaping status: Never Used  Substance Use Topics   Alcohol use: No    Alcohol/week: 0.0 standard drinks of alcohol   Drug use: No     Allergies  Allergen Reactions   Wellbutrin  [Bupropion ]     Bad dreams and memory issues    Review of Systems NEGATIVE UNLESS OTHERWISE INDICATED IN HPI      Objective:     BP 120/64 (BP Location: Left Arm, Patient Position: Sitting, Cuff Size: Large)   Pulse 77   Temp (!) 97.2 F (36.2 C) (Temporal)   Ht 5' 2 (1.575 m)   Wt 244 lb 6.4 oz (110.9 kg)   SpO2 98%   BMI 44.70 kg/m   Wt Readings from Last 3 Encounters:  01/31/23 244  lb 6.4 oz (110.9 kg)  12/28/22 246 lb 12.8 oz (111.9 kg)  12/05/22 250 lb 9.6 oz (113.7 kg)    BP Readings from Last 3 Encounters:  01/31/23 120/64  12/28/22 136/80  12/05/22 132/72     Physical Exam Vitals and nursing note reviewed.  Constitutional:      Appearance: Normal appearance. She is obese. She is not toxic-appearing.  HENT:     Head: Normocephalic and atraumatic.     Right Ear: External ear normal.     Left Ear: External ear normal.  Eyes:     Extraocular Movements: Extraocular movements intact.     Conjunctiva/sclera: Conjunctivae normal.     Pupils: Pupils are equal, round, and reactive to light.  Cardiovascular:     Rate and Rhythm: Normal rate and regular rhythm.     Pulses: Normal pulses.     Heart sounds: Normal heart sounds.  Pulmonary:     Effort: Pulmonary effort is normal.     Breath sounds: Normal breath sounds.  Musculoskeletal:        General: Normal range of motion.      Cervical back: Normal range of motion and neck supple.     Right lower leg: Edema present.     Left lower leg: Edema present.  Skin:    General: Skin is warm and dry.  Neurological:     General: No focal deficit present.     Mental Status: She is alert and oriented to person, place, and time.  Psychiatric:        Mood and Affect: Mood normal.        Behavior: Behavior normal.           Stiven Kaspar M Maricus Tanzi, PA-C

## 2023-01-31 NOTE — Patient Instructions (Addendum)
 Check out [employer] with Weston Brass on [address] on lexapro 10 mg daily to help with depression  Reach out if any concerns

## 2023-02-17 ENCOUNTER — Other Ambulatory Visit: Payer: Self-pay | Admitting: Physician Assistant

## 2023-03-09 ENCOUNTER — Other Ambulatory Visit: Payer: Self-pay | Admitting: Internal Medicine

## 2023-03-09 ENCOUNTER — Other Ambulatory Visit: Payer: Self-pay | Admitting: Nurse Practitioner

## 2023-03-09 DIAGNOSIS — K760 Fatty (change of) liver, not elsewhere classified: Secondary | ICD-10-CM

## 2023-03-09 DIAGNOSIS — R7401 Elevation of levels of liver transaminase levels: Secondary | ICD-10-CM | POA: Diagnosis not present

## 2023-03-09 DIAGNOSIS — Z148 Genetic carrier of other disease: Secondary | ICD-10-CM | POA: Diagnosis not present

## 2023-03-17 ENCOUNTER — Other Ambulatory Visit: Payer: Self-pay

## 2023-03-23 ENCOUNTER — Inpatient Hospital Stay: Admission: RE | Admit: 2023-03-23 | Payer: BC Managed Care – PPO | Source: Ambulatory Visit

## 2023-04-15 ENCOUNTER — Other Ambulatory Visit: Payer: Self-pay | Admitting: Physician Assistant

## 2023-05-31 ENCOUNTER — Ambulatory Visit: Payer: BC Managed Care – PPO | Admitting: Physician Assistant

## 2023-06-09 ENCOUNTER — Other Ambulatory Visit: Payer: Self-pay | Admitting: Physician Assistant

## 2023-06-20 ENCOUNTER — Ambulatory Visit: Admitting: Internal Medicine

## 2023-07-11 ENCOUNTER — Ambulatory Visit: Admitting: Physician Assistant

## 2023-07-18 ENCOUNTER — Encounter: Payer: Self-pay | Admitting: Physician Assistant

## 2023-07-18 ENCOUNTER — Ambulatory Visit: Admitting: Physician Assistant

## 2023-07-18 VITALS — BP 130/70 | HR 62 | Temp 96.8°F | Ht 62.0 in | Wt 247.4 lb

## 2023-07-18 DIAGNOSIS — D509 Iron deficiency anemia, unspecified: Secondary | ICD-10-CM

## 2023-07-18 DIAGNOSIS — E538 Deficiency of other specified B group vitamins: Secondary | ICD-10-CM | POA: Diagnosis not present

## 2023-07-18 DIAGNOSIS — R7303 Prediabetes: Secondary | ICD-10-CM | POA: Diagnosis not present

## 2023-07-18 DIAGNOSIS — N1832 Chronic kidney disease, stage 3b: Secondary | ICD-10-CM | POA: Diagnosis not present

## 2023-07-18 DIAGNOSIS — I10 Essential (primary) hypertension: Secondary | ICD-10-CM

## 2023-07-18 DIAGNOSIS — E559 Vitamin D deficiency, unspecified: Secondary | ICD-10-CM | POA: Diagnosis not present

## 2023-07-18 DIAGNOSIS — G6289 Other specified polyneuropathies: Secondary | ICD-10-CM | POA: Diagnosis not present

## 2023-07-18 DIAGNOSIS — F339 Major depressive disorder, recurrent, unspecified: Secondary | ICD-10-CM

## 2023-07-18 LAB — CBC WITH DIFFERENTIAL/PLATELET
Basophils Absolute: 0 10*3/uL (ref 0.0–0.1)
Basophils Relative: 0.8 % (ref 0.0–3.0)
Eosinophils Absolute: 0.2 10*3/uL (ref 0.0–0.7)
Eosinophils Relative: 3.7 % (ref 0.0–5.0)
HCT: 36.1 % (ref 36.0–46.0)
Hemoglobin: 12 g/dL (ref 12.0–15.0)
Lymphocytes Relative: 29.2 % (ref 12.0–46.0)
Lymphs Abs: 1.7 10*3/uL (ref 0.7–4.0)
MCHC: 33.2 g/dL (ref 30.0–36.0)
MCV: 86 fl (ref 78.0–100.0)
Monocytes Absolute: 0.5 10*3/uL (ref 0.1–1.0)
Monocytes Relative: 9.5 % (ref 3.0–12.0)
Neutro Abs: 3.3 10*3/uL (ref 1.4–7.7)
Neutrophils Relative %: 56.8 % (ref 43.0–77.0)
Platelets: 202 10*3/uL (ref 150.0–400.0)
RBC: 4.19 Mil/uL (ref 3.87–5.11)
RDW: 15.7 % — ABNORMAL HIGH (ref 11.5–15.5)
WBC: 5.8 10*3/uL (ref 4.0–10.5)

## 2023-07-18 LAB — COMPREHENSIVE METABOLIC PANEL WITH GFR
ALT: 28 U/L (ref 0–35)
AST: 29 U/L (ref 0–37)
Albumin: 4.3 g/dL (ref 3.5–5.2)
Alkaline Phosphatase: 144 U/L — ABNORMAL HIGH (ref 39–117)
BUN: 22 mg/dL (ref 6–23)
CO2: 27 meq/L (ref 19–32)
Calcium: 9.6 mg/dL (ref 8.4–10.5)
Chloride: 105 meq/L (ref 96–112)
Creatinine, Ser: 1.18 mg/dL (ref 0.40–1.20)
GFR: 49.24 mL/min — ABNORMAL LOW (ref >60.00)
Glucose, Bld: 97 mg/dL (ref 70–99)
Potassium: 4.1 meq/L (ref 3.5–5.1)
Sodium: 139 meq/L (ref 135–145)
Total Bilirubin: 0.8 mg/dL (ref 0.2–1.2)
Total Protein: 7.4 g/dL (ref 6.0–8.3)

## 2023-07-18 LAB — MICROALBUMIN / CREATININE URINE RATIO
Creatinine,U: 65.1 mg/dL
Microalb Creat Ratio: 25.1 mg/g (ref 0.0–30.0)
Microalb, Ur: 1.6 mg/dL (ref 0.0–1.9)

## 2023-07-18 LAB — VITAMIN D 25 HYDROXY (VIT D DEFICIENCY, FRACTURES): VITD: 19.79 ng/mL — ABNORMAL LOW (ref 30.00–100.00)

## 2023-07-18 LAB — HEMOGLOBIN A1C: Hgb A1c MFr Bld: 6.9 % — ABNORMAL HIGH (ref 4.6–6.5)

## 2023-07-18 LAB — VITAMIN B12: Vitamin B-12: 271 pg/mL (ref 211–911)

## 2023-07-18 MED ORDER — GABAPENTIN 300 MG PO CAPS
300.0000 mg | ORAL_CAPSULE | Freq: Every day | ORAL | 3 refills | Status: AC
Start: 2023-07-18 — End: ?

## 2023-07-18 NOTE — Patient Instructions (Signed)
  VISIT SUMMARY: Today, we discussed your stress-related headaches, medication management, and several ongoing health issues. We reviewed your current medications and made plans for further testing and follow-up appointments.  YOUR PLAN: STRESS-RELATED HEADACHES: Your headaches are likely due to stress from financial concerns and global events. -Try stress management techniques, such as limiting news consumption and engaging in stress-reducing activities.  CHRONIC KIDNEY DISEASE: Your chronic kidney disease requires monitoring of kidney function and potassium levels. -We will order lab tests to monitor your kidney function and potassium levels. -Consider taking potassium supplements if your levels are low.  PERIPHERAL EDEMA: You have swelling in your legs, which is managed with compression socks and Lasix . -Continue using compression socks and taking Lasix  as prescribed. -Follow up with your heart specialist in August.  NERVE PAIN: You have nerve pain in your legs, which is managed with gabapentin . -Continue taking gabapentin  at bedtime. -We will prescribe a 90-day supply of gabapentin .  PREDIABETES: Your hemoglobin A1c levels are in the prediabetic range. -We will order lab tests to monitor your hemoglobin A1c levels.  HYPERLIPIDEMIA: You have high cholesterol, which is managed with Crestor . -Continue taking Crestor  as prescribed.  DEPRESSION: You discontinued Lexapro  due to side effects. -Discontinue Lexapro .  GENERAL HEALTH MAINTENANCE: You have an upcoming mammogram and need to check your vitamin D  and B12 levels. -Perform your mammogram on June 17. -We will order lab tests for your vitamin D  and B12 levels. -Consider vitamin D  supplementation based on lab results.  FOLLOW-UP: We need to monitor your conditions and adjust treatment as necessary. -Schedule a follow-up appointment in October or November. -We will review your lab results and adjust your treatment if  necessary.                      Contains text generated by [employer].                                 Contains text generated by [employer].

## 2023-07-18 NOTE — Progress Notes (Signed)
 Patient ID: GEORGENIA SALIM, female    DOB: April 21, 1960, 63 y.o.   MRN: [id number]   Assessment & Plan:  Other polyneuropathy -     Gabapentin ; Take 1 capsule (300 mg total) by mouth at bedtime.  Dispense: 90 capsule; Refill: 3 -     CBC with Differential/Platelet -     VITAMIN D  25 Hydroxy (Vit-D Deficiency, Fractures) -     Vitamin B12  Vitamin D  deficiency -     VITAMIN D  25 Hydroxy (Vit-D Deficiency, Fractures)  B12 deficiency -     Vitamin B12  Iron deficiency anemia, unspecified iron deficiency anemia type -     CBC with Differential/Platelet  Depression, recurrent (HCC)  Prediabetes -     Comprehensive metabolic panel with GFR -     Hemoglobin A1c  Primary hypertension -     CBC with Differential/Platelet -     Comprehensive metabolic panel with GFR -     Hemoglobin A1c  CKD stage G3b/A1, GFR 30-44 and albumin creatinine ratio <30 mg/g (HCC) -     Comprehensive metabolic panel with GFR -     Microalbumin / creatinine urine ratio      Assessment & Plan Stress-related Headaches Headaches attributed to stress from financial concerns and global events, likely tension-type, affecting sleep and overall well-being. - Discuss stress management techniques, including limiting news consumption and engaging in stress-reducing activities  Chronic Kidney Disease Chronic kidney disease managed with Lasix , necessitating monitoring of kidney function and potassium levels to prevent complications. - Order lab tests to monitor kidney function and potassium levels - Consider potassium supplements if levels are low  Peripheral Edema Peripheral edema managed with compression socks and Lasix , with a follow-up scheduled with her heart specialist in August. - Continue use of compression socks and Lasix  as prescribed - Follow up with heart specialist in August  Nerve Pain Nerve pain, likely in legs, managed with gabapentin , which aids pain and sleep without adverse effects. -  Continue gabapentin  at bedtime - Prescribe a 90-day supply of gabapentin   Prediabetes Hemoglobin A1c in prediabetic range, requiring monitoring to prevent progression to diabetes. - Order lab tests to monitor hemoglobin A1c levels  Hyperlipidemia Hyperlipidemia managed with Crestor . - Continue Crestor  as prescribed  Depression Discontinued Lexapro  due to side effects, including feeling 'crazy' and experiencing weird dreams. - Discontinue Lexapro   General Health Maintenance Due for a mammogram this month with an appointment scheduled. Vitamin D  and B12 levels will be checked due to potential contribution to nerve pain. - Perform mammogram on June 17 - Order lab tests for vitamin D  and B12 levels - Consider vitamin D  supplementation based on lab results  Follow-up Follow-up planned in four to six months to monitor conditions and adjust treatment as necessary. - Schedule follow-up appointment in October or November - Review lab results and adjust treatment if necessary      Return in about 4 months (around 11/17/2023) for recheck/follow-up.    Subjective:    Chief Complaint  Patient presents with   Medical Management of Chronic Issues    Pt is not fasting; Prevnar 20 (2/21), mammogram scheduled 7/17    HPI Discussed the use of AI scribe software for clinical note transcription with the patient, who gave verbal consent to proceed.  History of Present Illness JAZARIAH TEALL is a 63 year old female with prediabetes and hypertension who presents with stress-related headaches and medication management.  She experiences significant stress related to financial  concerns and global events, which she believes contributes to her headaches. Her blood pressure is stable, and she attributes her headaches to stress rather than hypertension.  She has a history of prediabetes and is currently taking Jardiance . She was unable to attend a specialist appointment for her liver due to a  recent bout of the flu. She mentions a previous recommendation for a colonoscopy due to potential blood in her stool, which she has not yet completed. She lives alone and does not have a companion to stay for liver biopsy or colonoscopy.  She experiences leg swelling and continues to wear compression socks and take diuretics (Lasix ) for this issue. She has not been taking potassium supplements with her Lasix .  She discontinued Lexapro  due to side effects, including feeling 'crazy' and experiencing 'weird dreams.' She continues to take gabapentin  daily for nerve pain, which she experiences in her legs, and reports it helps with sleep. She is not currently taking iron or vitamin D  supplements.  She mentions upcoming medical appointments, including a mammogram and a follow-up with a cardiologist in August. No diarrhea, black or bloody stools, and her bowel movements are regular. No current issues with her blood pressure.     Past Medical History:  Diagnosis Date   Abnormal EKG    Cardiac murmur    CKD (chronic kidney disease)    Depression    Dyspnea    Elevated liver enzymes    HTN (hypertension)    Iron deficiency anemia    Lower extremity edema    Obesity     Past Surgical History:  Procedure Laterality Date   ABDOMINAL HYSTERECTOMY     TUBAL LIGATION      Family History  Problem Relation Age of Onset   Dementia Mother    Heart attack Father    Dementia Father    Hypertension Sister    Diabetes Brother    CVA Brother    Colon cancer Neg Hx    Stomach cancer Neg Hx    Esophageal cancer Neg Hx    Colon polyps Neg Hx     Social History   Tobacco Use   Smoking status: Never   Smokeless tobacco: Never  Vaping Use   Vaping status: Never Used  Substance Use Topics   Alcohol use: No    Alcohol/week: 0.0 standard drinks of alcohol   Drug use: No     Allergies  Allergen Reactions   Bupropion  Other (See Comments)    Bad dreams and memory issues    Review of  Systems NEGATIVE UNLESS OTHERWISE INDICATED IN HPI      Objective:     BP 130/70   Pulse 62   Temp (!) 96.8 F (36 C)   Ht 5' 2 (1.575 m)   Wt 247 lb 6.4 oz (112.2 kg)   SpO2 95%   BMI 45.25 kg/m   Wt Readings from Last 3 Encounters:  07/18/23 247 lb 6.4 oz (112.2 kg)  01/31/23 244 lb 6.4 oz (110.9 kg)  12/28/22 246 lb 12.8 oz (111.9 kg)    BP Readings from Last 3 Encounters:  07/18/23 130/70  01/31/23 120/64  12/28/22 136/80     Physical Exam Vitals and nursing note reviewed.  Constitutional:      Appearance: Normal appearance. She is obese. She is not toxic-appearing.  HENT:     Head: Normocephalic and atraumatic.     Right Ear: External ear normal.     Left Ear: External ear normal.  Eyes:     Extraocular Movements: Extraocular movements intact.     Conjunctiva/sclera: Conjunctivae normal.     Pupils: Pupils are equal, round, and reactive to light.    Cardiovascular:     Rate and Rhythm: Normal rate and regular rhythm.     Pulses: Normal pulses.     Heart sounds: Normal heart sounds.  Pulmonary:     Effort: Pulmonary effort is normal.     Breath sounds: Normal breath sounds.   Musculoskeletal:        General: Normal range of motion.     Cervical back: Normal range of motion and neck supple.     Right lower leg: Edema present.     Left lower leg: Edema present.   Skin:    General: Skin is warm and dry.   Neurological:     General: No focal deficit present.     Mental Status: She is alert and oriented to person, place, and time.   Psychiatric:        Mood and Affect: Mood normal.        Behavior: Behavior normal.             Josemaria Brining M Samyuktha Brau, PA-C

## 2023-08-08 ENCOUNTER — Ambulatory Visit: Admitting: Physician Assistant

## 2023-08-08 ENCOUNTER — Encounter: Payer: Self-pay | Admitting: Physician Assistant

## 2023-08-08 VITALS — BP 102/70 | HR 88 | Temp 98.2°F | Ht 62.0 in | Wt 246.8 lb

## 2023-08-08 DIAGNOSIS — E559 Vitamin D deficiency, unspecified: Secondary | ICD-10-CM

## 2023-08-08 DIAGNOSIS — E119 Type 2 diabetes mellitus without complications: Secondary | ICD-10-CM | POA: Diagnosis not present

## 2023-08-08 DIAGNOSIS — F419 Anxiety disorder, unspecified: Secondary | ICD-10-CM | POA: Diagnosis not present

## 2023-08-08 DIAGNOSIS — E538 Deficiency of other specified B group vitamins: Secondary | ICD-10-CM

## 2023-08-08 DIAGNOSIS — Z7984 Long term (current) use of oral hypoglycemic drugs: Secondary | ICD-10-CM

## 2023-08-08 DIAGNOSIS — Z780 Asymptomatic menopausal state: Secondary | ICD-10-CM | POA: Diagnosis not present

## 2023-08-08 DIAGNOSIS — D509 Iron deficiency anemia, unspecified: Secondary | ICD-10-CM | POA: Diagnosis not present

## 2023-08-08 DIAGNOSIS — F32A Depression, unspecified: Secondary | ICD-10-CM

## 2023-08-08 MED ORDER — METFORMIN HCL ER 500 MG PO TB24: 500.0000 mg | ORAL_TABLET | Freq: Every day | ORAL | 2 refills | Status: DC

## 2023-08-08 NOTE — Progress Notes (Signed)
 Patient ID: Alexandria Morrison, female    DOB: 31-May-1960, 63 y.o.   MRN: [id number]   Assessment & Plan:  New onset type 2 diabetes mellitus (HCC) -     metFORMIN  HCl ER; Take 1 tablet (500 mg total) by mouth daily with breakfast.  Dispense: 30 tablet; Refill: 2  Anxiety and depression -     Ambulatory referral to Psychology  Post-menopausal -     DG Bone Density; Future  Iron deficiency anemia, unspecified iron deficiency anemia type  Vitamin D  deficiency  B12 deficiency      Assessment & Plan Type 2 Diabetes Mellitus A1c is 6.9%, confirming Type 2 Diabetes Mellitus. She consumes high-sugar beverages, contributing to hyperglycemia. She prefers to avoid injectable medications. Metformin  was discussed as a first-line treatment due to its efficacy and long-standing use, with potential gastrointestinal side effects noted. - Prescribe metformin  to be taken in the evening with food - Advise reducing intake of sugary drinks and high-carbohydrate foods - Provide educational materials on diabetes-friendly snacks - Encourage regular physical activity, such as walking Lab Results  Component Value Date   HGBA1C 6.9 (H) 07/18/2023   HGBA1C 6.2 12/28/2022   HGBA1C 6.3 (A) 09/27/2022     Depressive symptoms She experiences depressive symptoms, including passive suicidal ideation, attributed to job-related stress. No concrete thoughts or plans of suicide. Her faith serves as a protective factor. Previous medication trials were unsuccessful. Counseling options were discussed, and she agreed to sessions with the clinic's counselor. - Refer to counseling services for mental health support - Encourage participation in counseling sessions  Vitamin D  deficiency Lab results indicate a very low vitamin D  level. She has not been taking a vitamin D  supplement. - Recommend over-the-counter vitamin D  supplementation  Vitamin B12 deficiency Lab results show a slightly low B12 level. She has not  been taking a B12 supplement. - Recommend over-the-counter vitamin B12 supplementation  Iron deficiency Iron levels were borderline but have improved, possibly due to dietary changes such as increased intake of green leafy vegetables. She has not been taking an iron supplement for months. - Continue dietary intake of iron-rich foods  General Health Maintenance She is due for routine health screenings and has been scheduled for a mammogram. A bone density test is also planned. - Schedule bone density test - Confirm mammogram appointment for Thursday  Follow-up She is scheduled for a follow-up appointment in October to reassess her conditions and the effectiveness of the treatment plan. - Schedule follow-up appointment in October     F/up as scheduled     Subjective:    Chief Complaint  Patient presents with   Diabetes    Pt in office to discuss recent labs and new onset of Diabetes; when going over medications pt admits to not taking Statin as well as Potassium, Iron, and Magnesium. Pt needing refill of Statin and states it was never received     HPI Discussed the use of AI scribe software for clinical note transcription with the patient, who gave verbal consent to proceed.  History of Present Illness Alexandria Morrison is a 63 year old female who presents for a follow-up visit regarding her mental health and diabetes management.  She is experiencing increased stress due to potential job changes and the possibility of being laid off. She has thoughts of not wanting to be here but denies any concrete plans or thoughts of self-harm, attributing her faith as a protective factor. She has previously tried medications  for mental health but did not find them helpful. She is considering counseling to help manage her mental health concerns.  Her diabetes management is ongoing, with recent lab results showing an A1c of 6.9%. She consumes sugary drinks like [employer] and sweet tea  regularly. She reports numbness and tingling in her legs.  Recent lab work indicated low levels of vitamin D  and B12, which she has not been supplementing. She has not been taking her iron supplement for months but has been consuming more salads, which may have contributed to improved iron levels.  She works 12-hour shifts and has a schedule that includes days off on Wednesday and Thursday, with alternating weekends off.     Past Medical History:  Diagnosis Date   Abnormal EKG    Cardiac murmur    CKD (chronic kidney disease)    Depression    Dyspnea    Elevated liver enzymes    HTN (hypertension)    Iron deficiency anemia    Lower extremity edema    Obesity     Past Surgical History:  Procedure Laterality Date   ABDOMINAL HYSTERECTOMY     TUBAL LIGATION      Family History  Problem Relation Age of Onset   Dementia Mother    Heart attack Father    Dementia Father    Hypertension Sister    Diabetes Brother    CVA Brother    Colon cancer Neg Hx    Stomach cancer Neg Hx    Esophageal cancer Neg Hx    Colon polyps Neg Hx     Social History   Tobacco Use   Smoking status: Never   Smokeless tobacco: Never  Vaping Use   Vaping status: Never Used  Substance Use Topics   Alcohol use: No    Alcohol/week: 0.0 standard drinks of alcohol   Drug use: No     Allergies  Allergen Reactions   Bupropion  Other (See Comments)    Bad dreams and memory issues    Review of Systems NEGATIVE UNLESS OTHERWISE INDICATED IN HPI      Objective:     BP 102/70 (BP Location: Left Arm, Patient Position: Sitting, Cuff Size: Large)   Pulse 88   Temp 98.2 F (36.8 C) (Temporal)   Ht 5' 2 (1.575 m)   Wt 246 lb 12.8 oz (111.9 kg)   SpO2 98%   BMI 45.14 kg/m   Wt Readings from Last 3 Encounters:  08/08/23 246 lb 12.8 oz (111.9 kg)  07/18/23 247 lb 6.4 oz (112.2 kg)  01/31/23 244 lb 6.4 oz (110.9 kg)    BP Readings from Last 3 Encounters:  08/08/23 102/70  07/18/23  130/70  01/31/23 120/64     Physical Exam Vitals and nursing note reviewed.  Constitutional:      Appearance: Normal appearance. She is obese. She is not toxic-appearing.  HENT:     Head: Normocephalic and atraumatic.     Right Ear: External ear normal.     Left Ear: External ear normal.  Eyes:     Extraocular Movements: Extraocular movements intact.     Conjunctiva/sclera: Conjunctivae normal.     Pupils: Pupils are equal, round, and reactive to light.  Cardiovascular:     Rate and Rhythm: Normal rate and regular rhythm.     Pulses: Normal pulses.     Heart sounds: Normal heart sounds.  Pulmonary:     Effort: Pulmonary effort is normal.     Breath  sounds: Normal breath sounds.  Musculoskeletal:        General: Normal range of motion.     Cervical back: Normal range of motion and neck supple.     Right lower leg: Edema present.     Left lower leg: Edema present.  Skin:    General: Skin is warm and dry.  Neurological:     General: No focal deficit present.     Mental Status: She is alert and oriented to person, place, and time.  Psychiatric:        Mood and Affect: Mood normal.        Behavior: Behavior normal.             Maliha Outten M Imoni Kohen, PA-C

## 2023-08-08 NOTE — Patient Instructions (Signed)
  VISIT SUMMARY: Today, we discussed your mental health and diabetes management. We also reviewed your recent lab results and made plans to address vitamin deficiencies and general health maintenance.  YOUR PLAN: TYPE 2 DIABETES MELLITUS: Your A1c level is 6.9%, indicating Type 2 Diabetes. You consume high-sugar beverages, which is contributing to high blood sugar levels. -Start taking metformin  in the evening with food. -Reduce intake of sugary drinks and high-carbohydrate foods. -Review educational materials on diabetes-friendly snacks. -Engage in regular physical activity, such as walking.  DEPRESSIVE SYMPTOMS: You are experiencing depressive symptoms due to job-related stress, but your faith is helping you cope. Previous medications were not helpful. -Attend counseling sessions for mental health support. -Participate actively in counseling sessions.  VITAMIN D  DEFICIENCY: Your lab results show very low vitamin D  levels. -Start taking an over-the-counter vitamin D  supplement.  VITAMIN B12 DEFICIENCY: Your lab results show slightly low B12 levels. -Start taking an over-the-counter vitamin B12 supplement.  IRON DEFICIENCY: Your iron levels have improved, likely due to dietary changes. -Continue eating iron-rich foods like green leafy vegetables.  GENERAL HEALTH MAINTENANCE: You are due for routine health screenings. -Schedule a bone density test. -Confirm your mammogram appointment for Thursday.  FOLLOW-UP: We need to reassess your conditions and the effectiveness of your treatment plan. -Schedule a follow-up appointment in October.                      Contains text generated by [employer]bridge.                                 Contains text generated by Abridge.

## 2023-08-10 ENCOUNTER — Ambulatory Visit
Admission: RE | Admit: 2023-08-10 | Discharge: 2023-08-10 | Disposition: A | Discharge status: Home / Self Care | Payer: BC Managed Care – PPO | Source: Ambulatory Visit | Attending: Physician Assistant

## 2023-08-10 ENCOUNTER — Ambulatory Visit
Admission: RE | Admit: 2023-08-10 | Discharge: 2023-08-10 | Disposition: A | Discharge status: Home / Self Care | Payer: BC Managed Care – PPO | Source: Ambulatory Visit | Attending: Physician Assistant | Admitting: Physician Assistant

## 2023-08-10 DIAGNOSIS — N6322 Unspecified lump in the left breast, upper inner quadrant: Secondary | ICD-10-CM | POA: Diagnosis not present

## 2023-08-10 DIAGNOSIS — N6320 Unspecified lump in the left breast, unspecified quadrant: Secondary | ICD-10-CM

## 2023-08-14 ENCOUNTER — Ambulatory Visit: Payer: Self-pay | Admitting: Physician Assistant

## 2023-08-18 ENCOUNTER — Other Ambulatory Visit: Payer: Self-pay | Admitting: Internal Medicine

## 2023-09-07 ENCOUNTER — Other Ambulatory Visit: Payer: Self-pay | Admitting: Nurse Practitioner

## 2023-09-07 DIAGNOSIS — R748 Abnormal levels of other serum enzymes: Secondary | ICD-10-CM | POA: Diagnosis not present

## 2023-09-07 DIAGNOSIS — E119 Type 2 diabetes mellitus without complications: Secondary | ICD-10-CM | POA: Insufficient documentation

## 2023-09-07 DIAGNOSIS — Z148 Genetic carrier of other disease: Secondary | ICD-10-CM

## 2023-09-07 DIAGNOSIS — K760 Fatty (change of) liver, not elsewhere classified: Secondary | ICD-10-CM | POA: Diagnosis not present

## 2023-09-07 DIAGNOSIS — K831 Obstruction of bile duct: Secondary | ICD-10-CM | POA: Diagnosis not present

## 2023-09-07 DIAGNOSIS — R7401 Elevation of levels of liver transaminase levels: Secondary | ICD-10-CM

## 2023-10-11 ENCOUNTER — Other Ambulatory Visit: Payer: Self-pay | Admitting: Internal Medicine

## 2023-10-19 ENCOUNTER — Other Ambulatory Visit

## 2023-11-06 ENCOUNTER — Other Ambulatory Visit: Payer: Self-pay | Admitting: Physician Assistant

## 2023-11-06 DIAGNOSIS — E119 Type 2 diabetes mellitus without complications: Secondary | ICD-10-CM

## 2023-11-11 ENCOUNTER — Other Ambulatory Visit: Payer: Self-pay | Admitting: Internal Medicine

## 2023-11-16 ENCOUNTER — Ambulatory Visit: Admitting: Physician Assistant

## 2023-11-16 ENCOUNTER — Encounter: Payer: Self-pay | Admitting: Physician Assistant

## 2023-11-16 VITALS — BP 100/62 | HR 68 | Temp 97.3°F | Ht 62.0 in | Wt 244.8 lb

## 2023-11-16 DIAGNOSIS — E1140 Type 2 diabetes mellitus with diabetic neuropathy, unspecified: Secondary | ICD-10-CM | POA: Diagnosis not present

## 2023-11-16 DIAGNOSIS — F439 Reaction to severe stress, unspecified: Secondary | ICD-10-CM | POA: Diagnosis not present

## 2023-11-16 DIAGNOSIS — I5032 Chronic diastolic (congestive) heart failure: Secondary | ICD-10-CM

## 2023-11-16 DIAGNOSIS — Z59869 Financial insecurity, unspecified: Secondary | ICD-10-CM

## 2023-11-16 DIAGNOSIS — Z7984 Long term (current) use of oral hypoglycemic drugs: Secondary | ICD-10-CM

## 2023-11-16 DIAGNOSIS — R45851 Suicidal ideations: Secondary | ICD-10-CM | POA: Diagnosis not present

## 2023-11-16 DIAGNOSIS — F339 Major depressive disorder, recurrent, unspecified: Secondary | ICD-10-CM

## 2023-11-16 LAB — POCT GLYCOSYLATED HEMOGLOBIN (HGB A1C): Hemoglobin A1C: 6.3 % — AB (ref 4.0–5.6)

## 2023-11-16 NOTE — Progress Notes (Signed)
 Patient ID: Alexandria Morrison, female    DOB: 1960-10-11, 63 y.o.   MRN: [id number]   Assessment & Plan:  Type 2 diabetes, controlled, with neuropathy (HCC) -     POCT glycosylated hemoglobin (Hb A1C)  Financial insecurity -     AMB Referral [facility] Care Management  Stress -     AMB Referral [facility] Care Management -     Ambulatory referral to Psychiatry  Suicidal thoughts -     Ambulatory referral to Psychiatry  Depression, recurrent -     Ambulatory referral to Psychiatry  Chronic heart failure with preserved ejection fraction [facility])    Assessment & Plan Major depressive disorder with suicidal ideation Major depressive disorder with worsening symptoms and persistent suicidal ideation. Previous medications trialed caused adverse effects including vivid dreams. Current mental health status requires further intervention. - Refer to mental health services for further evaluation and management. Flowsheet Row Office Visit from 11/16/2023 in [facility] Total Score 22      11/16/2023   11:11 AM 08/08/2023   10:52 AM 07/18/2023    9:04 AM 12/28/2022   11:10 AM  GAD 7 : Generalized Anxiety Score  Nervous, Anxious, on Edge 2 2 3  0  Control/stop worrying 2 2 2  0  Worry too much - different things 3 2 2  0  Trouble relaxing 2 2 2  0  Restless 1 1 1  0  Easily annoyed or irritable 1 1 1  0  Afraid - awful might happen 2 2 3  0  Total GAD 7 Score 13 12 14  0  Anxiety Difficulty Very difficult Somewhat difficult Somewhat difficult Not difficult at all      Type 2 diabetes mellitus with diabetic neuropathy Type 2 diabetes mellitus with improved glycemic control. A1c decreased from 6.9% to 6.3% since June. Reports neuropathic pain in feet, likely due to diabetic neuropathy. No gastrointestinal side effects from metformin . - Continue metformin  500 mg once daily. - Encourage continued lifestyle modifications including avoiding sugary drinks and  increasing physical activity. Lab Results  Component Value Date   HGBA1C 6.3 (A) 11/16/2023   HGBA1C 6.9 (H) 07/18/2023   HGBA1C 6.2 12/28/2022     Social determinants of health affecting care Significant financial stress impacting ability to afford basic needs such as food and utilities. No current support system in place. Potential impact on overall health and medication adherence. - Refer to social work for assistance with financial resources and support. - Ensure follow-up with social work team if no contact is made within a few weeks.   CHF - needs to reschedule with cardiology - gave her their number    Return in about 3 months (around 02/16/2024) for recheck/follow-up.    Subjective:    Chief Complaint  Patient presents with   Diabetes    4 month follow up.     HPI Discussed the use of AI scribe software for clinical note transcription with the patient, who gave verbal consent to proceed.  History of Present Illness Alexandria Morrison is a 63 year old female with type 2 diabetes who presents for follow-up.  She was diagnosed with type 2 diabetes in June, with an initial A1c of 6.9%. Since starting metformin  once daily, her A1c has improved to 6.3%. No gastrointestinal side effects from the medication. She is also taking spironolactone  and Jardiance , prescribed by her cardiologist.  She experiences pain in her feet, described as 'pains in the feet.'  Additionally, she notes swelling in her legs.  She is under significant stress and anxiety due to financial concerns, including difficulty paying bills and worries about job security. Most of her family members have passed away, leaving her with limited support. She has not been receiving any assistance with food and has been cutting back on expenses.  She admits to frequent thoughts of self-harm, although she has not made any attempts. She previously tried medication for these thoughts but experienced adverse effects,  including 'weird dreams.'  She drives herself and has no issues with transportation. Her blood pressure is reported as normal.     Past Medical History:  Diagnosis Date   Abnormal EKG    Cardiac murmur    CKD (chronic kidney disease)    Depression    Dyspnea    Elevated liver enzymes    HTN (hypertension)    Iron deficiency anemia    Lower extremity edema    Obesity     Past Surgical History:  Procedure Laterality Date   ABDOMINAL HYSTERECTOMY     TUBAL LIGATION      Family History  Problem Relation Age of Onset   Dementia Mother    Heart attack Father    Dementia Father    Hypertension Sister    Diabetes Brother    CVA Brother    Colon cancer Neg Hx    Stomach cancer Neg Hx    Esophageal cancer Neg Hx    Colon polyps Neg Hx     Social History   Tobacco Use   Smoking status: Never   Smokeless tobacco: Never  Vaping Use   Vaping status: Never Used  Substance Use Topics   Alcohol use: No    Alcohol/week: 0.0 standard drinks of alcohol   Drug use: No     Allergies  Allergen Reactions   Bupropion  Other (See Comments)    Bad dreams and memory issues    Review of Systems NEGATIVE UNLESS OTHERWISE INDICATED IN HPI      Objective:     BP 100/62 (BP Location: Left Arm, Patient Position: Sitting, Cuff Size: Large)   Pulse 68   Temp (!) 97.3 F (36.3 C) (Tympanic)   Ht 5' 2 (1.575 m)   Wt 244 lb 12.8 oz (111 kg)   SpO2 94%   BMI 44.77 kg/m   Wt Readings from Last 3 Encounters:  11/16/23 244 lb 12.8 oz (111 kg)  08/08/23 246 lb 12.8 oz (111.9 kg)  07/18/23 247 lb 6.4 oz (112.2 kg)    BP Readings from Last 3 Encounters:  11/16/23 100/62  08/08/23 102/70  07/18/23 130/70     Physical Exam Vitals and nursing note reviewed.  Constitutional:      Appearance: Normal appearance. She is obese. She is not toxic-appearing.  HENT:     Head: Normocephalic and atraumatic.     Right Ear: External ear normal.     Left Ear: External ear normal.   Eyes:     Extraocular Movements: Extraocular movements intact.     Conjunctiva/sclera: Conjunctivae normal.     Pupils: Pupils are equal, round, and reactive to light.  Cardiovascular:     Rate and Rhythm: Normal rate and regular rhythm.     Pulses: Normal pulses.     Heart sounds: Normal heart sounds.  Pulmonary:     Effort: Pulmonary effort is normal.     Breath sounds: Normal breath sounds.  Musculoskeletal:  General: Normal range of motion.     Cervical back: Normal range of motion and neck supple.     Right lower leg: Edema present.     Left lower leg: Edema present.  Skin:    General: Skin is warm and dry.  Neurological:     General: No focal deficit present.     Mental Status: She is alert and oriented to person, place, and time.  Psychiatric:        Mood and Affect: Mood normal.        Behavior: Behavior normal.             Kinberly Perris M Sharmayne Jablon, PA-C

## 2023-11-16 NOTE — Patient Instructions (Addendum)
 [facility] at [facility] [address] 5[address]  [phone] -- CALL to schedule follow-up appointment   Also,  A referral has been placed to our social care team and psychiatry team - they will reach out to schedule with you  If you develop suicidal thoughts, please tell someone and immediately proceed to our local 24/7 crisis center, [facility].[address], [place], [place] [phone].   Continue current medication regimen

## 2023-11-20 ENCOUNTER — Telehealth: Payer: Self-pay | Admitting: *Deleted

## 2023-11-20 NOTE — Progress Notes (Unsigned)
 Complex Care Management Note Care Guide Note  11/20/2023 Name: Alexandria Morrison MRN: [id number] DOB: May 28, 1960   Complex Care Management Outreach Attempts: An unsuccessful telephone outreach was attempted today to offer the patient information about available complex care management services.  Follow Up Plan:  Additional outreach attempts will be made to offer the patient complex care management information and services.   Encounter Outcome:  No Answer  Thedford Franks, CMA, [place]  [facility], [facility] Guide Direct Dial: [phone]  Fax: [phone] Website: [email or web address]

## 2023-11-21 NOTE — Progress Notes (Unsigned)
 Complex Care Management Note Care Guide Note  11/21/2023 Name: Alexandria Morrison MRN: [id number] DOB: 29-Sep-1960   Complex Care Management Outreach Attempts: A second unsuccessful outreach was attempted today to offer the patient with information about available complex care management services.  Follow Up Plan:  Additional outreach attempts will be made to offer the patient complex care management information and services.   Encounter Outcome:  No Answer  Thedford Franks, CMA [place]  [facility], [facility] Guide Direct Dial: [phone]  Fax: [phone] Website: [email or web address]

## 2023-11-22 NOTE — Progress Notes (Signed)
 Complex Care Management Note Care Guide Note  11/22/2023 Name: Alexandria Morrison MRN: [id number] DOB: [birth year]   Complex Care Management Outreach Attempts: A third unsuccessful outreach was attempted today to offer the patient with information about available complex care management services.  Follow Up Plan:  No further outreach attempts will be made at this time. We have been unable to contact the patient to offer or enroll patient in complex care management services.  Encounter Outcome:  No Answer  Thedford Franks, CMA [place]  [facility], [facility] Guide Direct Dial: [phone]  Fax: [phone] Website: [email or web address]

## 2023-12-11 ENCOUNTER — Other Ambulatory Visit: Payer: Self-pay | Admitting: Physician Assistant

## 2023-12-11 DIAGNOSIS — E119 Type 2 diabetes mellitus without complications: Secondary | ICD-10-CM

## 2023-12-14 ENCOUNTER — Other Ambulatory Visit

## 2023-12-14 ENCOUNTER — Telehealth (HOSPITAL_COMMUNITY): Payer: Self-pay | Admitting: Psychiatry

## 2023-12-14 NOTE — Telephone Encounter (Signed)
 D:  Pt's PCP at [facility] had referred pt to virtual MH-IOP.  A:  Placed call to orient pt, but there was no answer.  Left phone number for pt to call the case mgr back.  Inform Kyra (front desk) who forwarded referral from the workque.

## 2024-01-16 ENCOUNTER — Other Ambulatory Visit

## 2024-02-13 ENCOUNTER — Ambulatory Visit: Admitting: Physician Assistant

## 2024-02-26 ENCOUNTER — Ambulatory Visit: Admitting: Physician Assistant

## 2024-03-15 ENCOUNTER — Ambulatory Visit: Admitting: Physician Assistant
